# Patient Record
Sex: Female | Born: 1975 | Race: White | Hispanic: No | Marital: Married | Smoking: Former smoker
Health system: Southern US, Community
[De-identification: ages and names within clinical notes are randomized; demographics above are authoritative.]

## PROBLEM LIST (undated history)

## (undated) ENCOUNTER — Inpatient Hospital Stay (HOSPITAL_COMMUNITY): Payer: Self-pay

## (undated) DIAGNOSIS — R51 Headache: Secondary | ICD-10-CM

## (undated) DIAGNOSIS — Z803 Family history of malignant neoplasm of breast: Secondary | ICD-10-CM

## (undated) DIAGNOSIS — N979 Female infertility, unspecified: Secondary | ICD-10-CM

## (undated) DIAGNOSIS — E039 Hypothyroidism, unspecified: Secondary | ICD-10-CM

## (undated) DIAGNOSIS — F329 Major depressive disorder, single episode, unspecified: Secondary | ICD-10-CM

## (undated) DIAGNOSIS — F32A Depression, unspecified: Secondary | ICD-10-CM

## (undated) HISTORY — DX: Headache: R51

## (undated) HISTORY — PX: TONSILLECTOMY: SUR1361

## (undated) HISTORY — PX: KNEE SURGERY: SHX244

## (undated) HISTORY — DX: Major depressive disorder, single episode, unspecified: F32.9

## (undated) HISTORY — DX: Hypothyroidism, unspecified: E03.9

## (undated) HISTORY — DX: Depression, unspecified: F32.A

## (undated) HISTORY — DX: Female infertility, unspecified: N97.9

## (undated) HISTORY — DX: Family history of malignant neoplasm of breast: Z80.3

---

## 2012-11-06 ENCOUNTER — Encounter: Payer: Self-pay | Admitting: Family Medicine

## 2012-11-06 ENCOUNTER — Ambulatory Visit (INDEPENDENT_AMBULATORY_CARE_PROVIDER_SITE_OTHER): Payer: BC Managed Care – PPO | Admitting: Family Medicine

## 2012-11-06 VITALS — BP 110/64 | HR 83 | Temp 98.4°F | Ht 64.0 in | Wt 157.0 lb

## 2012-11-06 DIAGNOSIS — L259 Unspecified contact dermatitis, unspecified cause: Secondary | ICD-10-CM

## 2012-11-06 DIAGNOSIS — L309 Dermatitis, unspecified: Secondary | ICD-10-CM

## 2012-11-06 DIAGNOSIS — Z7689 Persons encountering health services in other specified circumstances: Secondary | ICD-10-CM

## 2012-11-06 DIAGNOSIS — Z7189 Other specified counseling: Secondary | ICD-10-CM

## 2012-11-06 MED ORDER — TRIAMCINOLONE ACETONIDE 0.1 % EX OINT: TOPICAL_OINTMENT | Freq: Two times a day (BID) | CUTANEOUS | Status: DC

## 2012-11-06 NOTE — Patient Instructions (Addendum)
-  dove or aveeno hypoallergenic soap, hypoallergenic detergent for your clothes and either cerave cream or cetaphil restorative lotion after bathing  -use the steroid cream for next 1-2 weeks and with flares  -We have ordered labs or studies at this visit. It can take up to 1-2 weeks for results and processing. We will contact you with instructions IF your results are abnormal. Normal results will be released to your [facility] If you have not heard from [place] or can not find your results in [facility] in 2 weeks please contact our office.  -PLEASE SIGN UP FOR MYCHART TODAY   We recommend the following healthy lifestyle measures: - eat a healthy diet consisting of lots of vegetables, fruits, beans, nuts, seeds, healthy meats such as white chicken and fish and whole grains.  - avoid fried foods, fast food, processed foods, sodas, red meet and other fattening foods.  - get a least 150 minutes of aerobic exercise per week.   Follow up in: 1 month

## 2012-11-06 NOTE — Progress Notes (Signed)
No chief complaint on file.   HPI:  Dawn Moss is here to establish care. Recently moved here from [place].  Last PCP and physical: sees Dr. Tenny Craw at [facility] - UTD on physical and pap.  Has the following chronic problems and concerns today:  Rash: -she has had this on and off seasonally -sometimes worse when in the heat -tx with steroids in the past and this clears it up -recently flared again for the last few weeks -does itch, does not hurt -usually underarms, arms, lower back -can't think of triggers other then seems to be related to heat - or being outside  There are no active problems to display for this patient.    ROS: See pertinent positives and negatives per HPI.  Past Medical History  Diagnosis Date  . Depression     Family History  Problem Relation Age of Onset  . Crohn's disease Mother   . Mental retardation Mother   . Hypertension Father   . Cancer Maternal Grandmother     breast cancer  . Heart disease Maternal Grandfather   . Diabetes Paternal Grandfather   . Heart disease Paternal Grandfather     History   Social History  . Marital Status: Single    Spouse Name: N/A    Number of Children: N/A  . Years of Education: N/A   Social History Main Topics  . Smoking status: Former Smoker    Quit date: 10/07/2010  . Smokeless tobacco: Never Used  . Alcohol Use: Yes     Comment: glass or 2 of wine per week  . Drug Use: No  . Sexually Active: None   Other Topics Concern  . 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 For 1-2 weeks as needed., Disp: 30 g, Rfl: 1  EXAM:  Filed Vitals:   11/06/12 1623  BP: 110/64  Pulse: 83  Temp: 98.4 F (36.9 C)    Body mass index is 26.94 kg/(m^2).  GENERAL: vitals reviewed and listed above, alert, oriented, appears well hydrated and in no acute distress  HEENT: atraumatic, conjunttiva clear, no obvious abnormalities on inspection of external nose and ears  NECK: no obvious masses on inspection  LUNGS: clear to auscultation bilaterally, no wheezes, rales or rhonchi, good air movement  CV: HRRR, no peripheral edema  MS: moves all extremities without noticeable abnormality  SKIN: fine erythematous papular rash in areas where clothes rub - armpits, waistband, thighs  PSYCH: pleasant and cooperative, no obvious depression or anxiety  ASSESSMENT AND PLAN:  Discussed the following assessment and plan:  Eczema - Plan: triamcinolone ointment (KENALOG) 0.1 % -Recommendations per orders an instructions, risks and use of medications and return precautions discussed.  Encounter to establish care  -We reviewed the PMH, PSH,  FH, SH, Meds and Allergies. -We provided refills for any medications we will prescribe as needed. -We addressed current concerns per orders and patient instructions. -We have asked for records for pertinent exams, studies, vaccines and notes from previous providers. -We have advised patient to follow up per instructions below. -screening labs for lipids and hgba1c done in last year per pt and normal  -Patient advised to return or notify a doctor immediately if symptoms worsen or persist or new concerns arise.  Patient Instructions  -dove or aveeno hypoallergenic soap, hypoallergenic detergent for your clothes and either cerave cream or cetaphil restorative lotion after  bathing  -use the steroid cream for next 1-2 weeks and with flares  -We have ordered labs or studies at this visit. It can take up to 1-2 weeks for results and processing. We will contact you with instructions IF your results are abnormal. Normal results will be released to your [facility]. If you have not heard from [place] or can not find your results in [facility] in 2 weeks please contact our office.  -PLEASE SIGN UP FOR MYCHART TODAY   We recommend the following healthy lifestyle measures: - eat a healthy diet consisting of lots of vegetables, fruits, beans, nuts, seeds, healthy meats such as white chicken and fish and whole grains.  - avoid fried foods, fast food, processed foods, sodas, red meet and other fattening foods.  - get a least 150 minutes of aerobic exercise per week.   Follow up in: 1 month      KIM, HANNAH R.

## 2012-11-26 ENCOUNTER — Encounter: Payer: Self-pay | Admitting: Family Medicine

## 2012-11-27 ENCOUNTER — Encounter: Payer: Self-pay | Admitting: Family Medicine

## 2012-11-27 NOTE — Telephone Encounter (Signed)
-  would advise follow up or refer to derm if she prefers?

## 2012-11-27 NOTE — Telephone Encounter (Signed)
Pls advise.  

## 2012-12-01 ENCOUNTER — Encounter: Payer: Self-pay | Admitting: Family Medicine

## 2012-12-01 ENCOUNTER — Ambulatory Visit (INDEPENDENT_AMBULATORY_CARE_PROVIDER_SITE_OTHER): Payer: BC Managed Care – PPO | Admitting: Family Medicine

## 2012-12-01 VITALS — BP 108/80 | Temp 98.9°F | Wt 164.0 lb

## 2012-12-01 DIAGNOSIS — L259 Unspecified contact dermatitis, unspecified cause: Secondary | ICD-10-CM

## 2012-12-01 DIAGNOSIS — L309 Dermatitis, unspecified: Secondary | ICD-10-CM

## 2012-12-01 NOTE — Progress Notes (Signed)
Chief Complaint  Patient presents with  . Rash    HPI:  Follow up rash: -hx eczema, this gets worse several times per year -thinks triggered by heat -tx with hypoallergenic regimen, triamcinilone and emollients last visit -all hypoallergenic products -itchy, painful at times -not really getting better  ROS: See pertinent positives and negatives per HPI.  Past Medical History  Diagnosis Date  . Depression     Family History  Problem Relation Age of Onset  . Crohn's disease Mother   . Mental retardation Mother   . Hypertension Father   . Cancer Maternal Grandmother     breast cancer  . Heart disease Maternal Grandfather   . Diabetes Paternal Grandfather   . Heart disease Paternal Grandfather     History   Social History  . Marital Status: Single    Spouse Name: N/A    Number of Children: N/A  . Years of Education: N/A   Social History Main Topics  . Smoking status: Former Smoker    Quit date: 10/07/2010  . Smokeless tobacco: Never Used  . Alcohol Use: Yes     Comment: glass or 2 of wine per week  . Drug Use: No  . Sexually Active: None   Other Topics Concern  . 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 For 1-2 weeks as needed., Disp: 30 g, Rfl: 1  EXAM:  Filed Vitals:   12/01/12 1514  BP: 108/80  Temp: 98.9 F (37.2 C)    Body mass index is 28.14  kg/(m^2).  GENERAL: vitals reviewed and listed above, alert, oriented, appears well hydrated and in no acute distress  HEENT: atraumatic, conjunttiva clear, no obvious abnormalities on inspection of external nose and ears  NECK: no obvious masses on inspection  SKIN: papular erythematous rash on buttocks, thighs, arms, legs, trunk  MS: moves all extremities without noticeable abnormality  PSYCH: pleasant and cooperative, no obvious depression or anxiety  ASSESSMENT AND PLAN:  Discussed the following assessment and plan:  Dermatitis - Plan: Ambulatory referral to Dermatology  -referral to derm, continue hypoallergenic regimen in the meantime -Patient advised to return or notify a doctor immediately if symptoms worsen or persist or new concerns arise.  There are no Patient Instructions on file for this visit.   Kriste Basque R.

## 2013-04-02 ENCOUNTER — Other Ambulatory Visit: Payer: Self-pay

## 2013-05-19 LAB — OB RESULTS CONSOLE ABO/RH: RH Type: POSITIVE

## 2013-05-19 LAB — OB RESULTS CONSOLE RPR: RPR: NONREACTIVE

## 2013-05-19 LAB — OB RESULTS CONSOLE ANTIBODY SCREEN: ANTIBODY SCREEN: NEGATIVE

## 2013-05-19 LAB — OB RESULTS CONSOLE HIV ANTIBODY (ROUTINE TESTING): HIV: NONREACTIVE

## 2013-05-19 LAB — OB RESULTS CONSOLE HEPATITIS B SURFACE ANTIGEN: Hepatitis B Surface Ag: NEGATIVE

## 2013-05-19 LAB — OB RESULTS CONSOLE RUBELLA ANTIBODY, IGM: Rubella: IMMUNE

## 2013-06-02 LAB — OB RESULTS CONSOLE GC/CHLAMYDIA
CHLAMYDIA, DNA PROBE: NEGATIVE
GC PROBE AMP, GENITAL: NEGATIVE

## 2013-11-26 LAB — OB RESULTS CONSOLE GBS: STREP GROUP B AG: NEGATIVE

## 2013-12-09 ENCOUNTER — Telehealth (HOSPITAL_COMMUNITY): Payer: Self-pay | Admitting: *Deleted

## 2013-12-09 ENCOUNTER — Encounter (HOSPITAL_COMMUNITY): Payer: Self-pay | Admitting: *Deleted

## 2013-12-09 NOTE — Telephone Encounter (Signed)
Preadmission screen  

## 2013-12-11 ENCOUNTER — Encounter (HOSPITAL_COMMUNITY): Payer: Self-pay | Admitting: Pharmacist

## 2013-12-12 ENCOUNTER — Inpatient Hospital Stay (HOSPITAL_COMMUNITY)
Admission: RE | Admit: 2013-12-12 | Discharge: 2013-12-12 | DRG: 781 | Disposition: A | Discharge status: Home / Self Care | Payer: Commercial Managed Care - PPO | Source: Ambulatory Visit | Attending: Obstetrics and Gynecology | Admitting: Obstetrics and Gynecology

## 2013-12-12 ENCOUNTER — Encounter (HOSPITAL_COMMUNITY): Payer: Self-pay

## 2013-12-12 DIAGNOSIS — Z8249 Family history of ischemic heart disease and other diseases of the circulatory system: Secondary | ICD-10-CM

## 2013-12-12 DIAGNOSIS — E039 Hypothyroidism, unspecified: Secondary | ICD-10-CM | POA: Diagnosis present

## 2013-12-12 DIAGNOSIS — Z833 Family history of diabetes mellitus: Secondary | ICD-10-CM

## 2013-12-12 DIAGNOSIS — O99340 Other mental disorders complicating pregnancy, unspecified trimester: Secondary | ICD-10-CM | POA: Diagnosis present

## 2013-12-12 DIAGNOSIS — O368390 Maternal care for abnormalities of the fetal heart rate or rhythm, unspecified trimester, not applicable or unspecified: Secondary | ICD-10-CM | POA: Diagnosis not present

## 2013-12-12 DIAGNOSIS — E079 Disorder of thyroid, unspecified: Secondary | ICD-10-CM | POA: Diagnosis present

## 2013-12-12 DIAGNOSIS — Z803 Family history of malignant neoplasm of breast: Secondary | ICD-10-CM

## 2013-12-12 DIAGNOSIS — O99280 Endocrine, nutritional and metabolic diseases complicating pregnancy, unspecified trimester: Secondary | ICD-10-CM

## 2013-12-12 DIAGNOSIS — O321XX0 Maternal care for breech presentation, not applicable or unspecified: Secondary | ICD-10-CM | POA: Diagnosis present

## 2013-12-12 DIAGNOSIS — F329 Major depressive disorder, single episode, unspecified: Secondary | ICD-10-CM | POA: Diagnosis present

## 2013-12-12 DIAGNOSIS — F3289 Other specified depressive episodes: Secondary | ICD-10-CM | POA: Diagnosis present

## 2013-12-12 DIAGNOSIS — Z87891 Personal history of nicotine dependence: Secondary | ICD-10-CM

## 2013-12-12 MED ORDER — BUTORPHANOL TARTRATE 1 MG/ML IJ SOLN
1.0000 mg | Freq: Once | INTRAMUSCULAR | Status: AC
  Administered 2013-12-12: 1 mg via INTRAVENOUS
  Filled 2013-12-12: qty 1

## 2013-12-12 MED ORDER — TERBUTALINE SULFATE 1 MG/ML IJ SOLN
INTRAMUSCULAR | Status: AC
  Filled 2013-12-12: qty 1

## 2013-12-12 MED ORDER — ONDANSETRON HCL 4 MG/2ML IJ SOLN
4.0000 mg | Freq: Once | INTRAMUSCULAR | Status: AC
  Administered 2013-12-12: 4 mg via INTRAVENOUS
  Filled 2013-12-12: qty 2

## 2013-12-12 MED ORDER — TERBUTALINE SULFATE 1 MG/ML IJ SOLN
0.2500 mg | Freq: Once | INTRAMUSCULAR | Status: AC
  Administered 2013-12-12: 11:00:00 via SUBCUTANEOUS

## 2013-12-12 MED ORDER — LACTATED RINGERS IV SOLN
INTRAVENOUS | Status: DC
  Administered 2013-12-12: 10:00:00 via INTRAVENOUS

## 2013-12-12 NOTE — Progress Notes (Signed)
Patient ID: Dawn Moss, female   DOB: 05/03/1976, 38 y.o.   MRN: [id number] Consent signed. FHR reactive.  Attempted forward roll x one , backward roll x 2. Brief episode of fetal bradycardia resolved. Unsuccessful ECV. Will monitor until FHR reactive post procedure.

## 2013-12-12 NOTE — H&P (Signed)
Dawn Moss is a 38 y.o. female presenting for External version.  Maternal Medical History:  Contractions: Frequency: rare.    Fetal activity: Perceived fetal activity is normal.   Last perceived fetal movement was within the past hour.    Prenatal complications: no prenatal complications Prenatal Complications - Diabetes: none.    OB History   Grav Para Term Preterm Abortions TAB SAB Ect Mult Living   2 1 1       1      Past Medical History  Diagnosis Date  . Depression   . Hypothyroidism   . Family history of malignant neoplasm of breast   . Infertility, female   . ZOXWRUEA(540.9Headache(784.0)    Past Surgical History  Procedure Laterality Date  . Tonsillectomy      1997  . Knee surgery     Family History: family history includes Cancer in her maternal grandmother; Crohn's disease in her mother; Depression in her mother; Diabetes in her paternal grandfather; Heart disease in her maternal grandfather and paternal grandfather; Hypertension in her father. Social History:  reports that she quit smoking about 3 years ago. She has never used smokeless tobacco. She reports that she drinks alcohol. She reports that she does not use illicit drugs.   Prenatal Transfer Tool  Maternal Diabetes: No Genetic Screening: Normal Maternal Ultrasounds/Referrals: Normal Fetal Ultrasounds or other Referrals:  None Maternal Substance Abuse:  No Significant Maternal Medications:  None Significant Maternal Lab Results:  None Other Comments:  None  Review of Systems  All other systems reviewed and are negative.     Blood pressure 118/72, pulse 83, temperature 98.2 F (36.8 C), temperature source Oral, resp. rate 20, height 5\' 4"  (1.626 m), weight 79.379 kg (175 lb), last menstrual period 03/22/2013. Maternal Exam:  Uterine Assessment: Contraction strength is mild.  Contraction frequency is rare.   Abdomen: Patient reports no abdominal tenderness. Fetal presentation: breech  Introitus: Normal  vulva. Normal vagina.  Ferning test: not done.  Nitrazine test: not done. Amniotic fluid character: not assessed.  Pelvis: adequate for delivery.   Cervix: Cervix evaluated by digital exam.     Physical Exam  Nursing note and vitals reviewed. Constitutional: She is oriented to person, place, and time. She appears well-developed and well-nourished.  HENT:  Head: Normocephalic and atraumatic.  Cardiovascular: Normal rate, regular rhythm and normal heart sounds.   Respiratory: Effort normal and breath sounds normal.  GI: Soft. Bowel sounds are normal.  Genitourinary: Vagina normal and uterus normal.  Musculoskeletal: Normal range of motion.  Neurological: She is alert and oriented to person, place, and time.  Skin: Skin is warm and dry.  Psychiatric: She has a normal mood and affect. Her behavior is normal. Judgment and thought content normal.    Prenatal labs: ABO, Rh: O/Positive/-- (12/23 0000) Antibody: Negative (12/23 0000) Rubella: Immune (12/23 0000) RPR: Nonreactive (12/23 0000)  HBsAg: Negative (12/23 0000)  HIV: Non-reactive (12/23 0000)  GBS: Negative (07/02 0000)   Assessment/Plan: 38 1/7 weeks Dawn Moss Breech For ECV Risks vs benefits discussed. Consent done.   Dawn Moss 12/12/2013, 10:31 AM

## 2013-12-12 NOTE — Progress Notes (Signed)
S:  Here for external cephalic version  O:  VS: Blood pressure 118/72, pulse 83, temperature 98.2 F (36.8 C), temperature source Oral, resp. rate 20, height 5\' 4"  (1.626 m), weight 79.379 kg (175 lb), last menstrual period 03/22/2013.        FHR : baseline 140 / variability moderate / accelerations + / no decelerations        Toco: no contractions or uterine irritability        Cervix : exam deferred at this time        Membranes: intact        Bedside SONO - frank breech / cephalic prominence left upper with spine down maternal left side                                     left sacral transverse                                    subjective fluid normal                                    (+) fetal cardiac activity @140   A: ECV attempt for breech presentation at term      Reactive NST with ctx  P: prepare for procedure - obtain consent      Stadol 1 mg IV for relaxation pre-procedure  Dr Billy Coastaavon notified   Marlinda MikeBAILEY, Sarayu Prevost CNM, MSN, [facility] 12/12/2013, 10:02 AM

## 2013-12-12 NOTE — Discharge Instructions (Signed)
Keep MD appt on Wednesday.

## 2013-12-16 ENCOUNTER — Other Ambulatory Visit: Payer: Self-pay | Admitting: Obstetrics and Gynecology

## 2013-12-16 ENCOUNTER — Encounter (HOSPITAL_COMMUNITY): Payer: Self-pay

## 2013-12-16 ENCOUNTER — Encounter (HOSPITAL_COMMUNITY)
Admission: RE | Admit: 2013-12-16 | Discharge: 2013-12-16 | Disposition: A | Discharge status: Home / Self Care | Payer: Commercial Managed Care - PPO | Source: Ambulatory Visit | Attending: Obstetrics and Gynecology | Admitting: Obstetrics and Gynecology

## 2013-12-16 VITALS — BP 113/80 | HR 105 | Temp 97.5°F | Resp 20 | Ht 64.0 in | Wt 176.0 lb

## 2013-12-16 DIAGNOSIS — O321XX1 Maternal care for breech presentation, fetus 1: Secondary | ICD-10-CM

## 2013-12-16 LAB — CBC
HCT: 37.8 % (ref 36.0–46.0)
HEMOGLOBIN: 13.6 g/dL (ref 12.0–15.0)
MCH: 33.4 pg (ref 26.0–34.0)
MCHC: 36 g/dL (ref 30.0–36.0)
MCV: 92.9 fL (ref 78.0–100.0)
Platelets: 198 10*3/uL (ref 150–400)
RBC: 4.07 MIL/uL (ref 3.87–5.11)
RDW: 14.2 % (ref 11.5–15.5)
WBC: 6.8 10*3/uL (ref 4.0–10.5)

## 2013-12-16 LAB — ABO/RH: ABO/RH(D): O POS

## 2013-12-16 LAB — TYPE AND SCREEN
ABO/RH(D): O POS
Antibody Screen: NEGATIVE

## 2013-12-16 LAB — RPR

## 2013-12-16 NOTE — Patient Instructions (Addendum)
[facility] - Preparing for Surgery  Before surgery, you can play an important role.  Because skin is not sterile, your skin needs to be as free of germs as possible.  You can reduce the number of germs on you skin by washing with CHG (chlorahexidine gluconate) soap before surgery.  CHG is an antiseptic cleaner which kills germs and bonds with the skin to continue killing germs even after washing.  Please DO NOT use if you have an allergy to CHG or antibacterial soaps.  If your skin becomes reddened/irritated stop using the CHG and inform your nurse when you arrive at Short Stay.  Do not shave (including legs and underarms) for at least 48 hours prior to the first CHG shower.  You may shave your face.  Please follow these instructions carefully:   1.  Shower with CHG Soap the night before surgery and the                                morning of Surgery.  2.  If you choose to wash your hair, wash your hair first as usual with your       normal shampoo.  3.  After you shampoo, rinse your hair and body thoroughly to remove the                      Shampoo.  4.  Use CHG as you would any other liquid soap.  You can apply chg directly       to the skin and wash gently with scrungie or a clean washcloth.  5.  Apply the CHG Soap to your body ONLY FROM THE NECK DOWN.        Do not use on open wounds or open sores.  Avoid contact with your eyes,       ears, mouth and genitals (private parts).  Wash genitals (private parts)       with your normal soap.  6.  Wash thoroughly, paying special attention to the area where your surgery        will be performed.  7.  Thoroughly rinse your body with warm water from the neck down.  8.  DO NOT shower/wash with your normal soap after using and rinsing off       the CHG Soap.  9.  Pat yourself dry with a clean towel.            10.  Wear clean pajamas.            11.  Place clean sheets on your bed the night of your first shower and do not        sleep with  pets.  Day of Surgery  Do not apply any lotions/deoderants the morning of surgery.  Please wear clean clothes to the hospital/surgery center.    Your procedure is scheduled on: July 24th   [facility] at: 8am Pick up the phone at the desk and dial [phone] and inform us of your arrival.  Please call this number if you have any problems the morning of surgery: [phone]  Remember: Do not eat food after midnight: Do not drink clear liquids after: Take these medicines the morning of surgery with a SIP OF WATER: take meds day of surgery with sip of water  Do not wear jewelry, make-up, or FINGER nail polish No metal  in your hair or on your body. Do not wear lotions, powders, perfumes.  You may wear deodorant.  Do not bring valuables to the hospital. Contacts, dentures or bridgework may not be worn into surgery.  Leave suitcase in the car. After Surgery it may be brought to your room. For patients being admitted to the hospital, checkout time is 11:00am the day of discharge.    Patients discharged on the day of surgery will not be allowed to drive home.

## 2013-12-17 NOTE — H&P (Signed)
NAMTrenton Moss:  Critzer, Stefany                  ACCOUNT NO.:  [id number]  MEDICAL RECORD NO.:  [id number]  LOCATION:                                 FACILITY:  PHYSICIAN:  Lenoard Adenichard J. Jeanee Fabre, M.D.DATE OF BIRTH:  01/12/1976  DATE OF ADMISSION: DATE OF DISCHARGE:                             HISTORY & PHYSICAL   CHIEF COMPLAINT:  Breech, for primary C-section.  HISTORY OF PRESENT ILLNESS:  She is a 38 year old white female G2, P1, at 2639 weeks gestation with persistent breech presentation and failed external cephalic version, for primary C-section.  ALLERGIES:  Shellfish.  MEDICATIONS:  Include Synthroid, prenatal vitamins, maybe some Claritin.  FAMILY HISTORY:  She has a family history of breast cancer and heart disease.  PREVIOUS HISTORY:  Vaginal delivery as noted.  PAST MEDICAL HISTORY:  Thyroid disease.  PHYSICAL EXAMINATION:  GENERAL:  She is a well-developed, well- nourished, white female, in no acute distress. HEENT:  Normal. NECK:  Supple.  Full range of motion. LUNGS:  Clear. HEART:  Regular rate and rhythm. ABDOMEN:  Soft, gravid, nontender.  Estimated fetal weight 7.5 pounds. GU:  Cervix is 2-3 cm, which is breech and -2. EXTREMITIES:  There were no cords. NEUROLOGIC:  Nonfocal. SKIN:  Intact.  IMPRESSION: 1. 39-week intrauterine pregnancy. 2. Refractory breech presentation, status post failed external     cephalic version.  PLAN:  Primary low segment transverse cesarean section.  Risks of anesthesia, infection, bleeding, injury to surrounding organs with possible need for repair was discussed.  Delayed versus immediate complications to include bowel and bladder injury noted.  The patient acknowledges and wishes to proceed.     Lenoard Adenichard J. Carnelius Hammitt, M.D.     RJT/MEDQ  D:  12/17/2013  T:  12/17/2013  Job:  [id number]

## 2013-12-18 ENCOUNTER — Encounter (HOSPITAL_COMMUNITY): Payer: Self-pay | Admitting: *Deleted

## 2013-12-18 ENCOUNTER — Inpatient Hospital Stay (HOSPITAL_COMMUNITY): Payer: Commercial Managed Care - PPO | Admitting: Anesthesiology

## 2013-12-18 ENCOUNTER — Encounter (HOSPITAL_COMMUNITY)
Admission: AD | Disposition: A | Discharge status: Home / Self Care | Payer: Self-pay | Source: Ambulatory Visit | Attending: Obstetrics and Gynecology

## 2013-12-18 ENCOUNTER — Inpatient Hospital Stay (HOSPITAL_COMMUNITY)
Admission: AD | Admit: 2013-12-18 | Discharge: 2013-12-20 | DRG: 766 | Disposition: A | Discharge status: Home / Self Care | Payer: Commercial Managed Care - PPO | Source: Ambulatory Visit | Attending: Obstetrics and Gynecology | Admitting: Obstetrics and Gynecology

## 2013-12-18 ENCOUNTER — Encounter (HOSPITAL_COMMUNITY): Payer: Commercial Managed Care - PPO | Admitting: Anesthesiology

## 2013-12-18 DIAGNOSIS — O09529 Supervision of elderly multigravida, unspecified trimester: Secondary | ICD-10-CM | POA: Diagnosis present

## 2013-12-18 DIAGNOSIS — O321XX0 Maternal care for breech presentation, not applicable or unspecified: Secondary | ICD-10-CM | POA: Diagnosis present

## 2013-12-18 DIAGNOSIS — O99344 Other mental disorders complicating childbirth: Secondary | ICD-10-CM | POA: Diagnosis present

## 2013-12-18 DIAGNOSIS — F341 Dysthymic disorder: Secondary | ICD-10-CM | POA: Diagnosis present

## 2013-12-18 DIAGNOSIS — E039 Hypothyroidism, unspecified: Secondary | ICD-10-CM | POA: Diagnosis present

## 2013-12-18 DIAGNOSIS — E079 Disorder of thyroid, unspecified: Secondary | ICD-10-CM | POA: Diagnosis present

## 2013-12-18 DIAGNOSIS — Z803 Family history of malignant neoplasm of breast: Secondary | ICD-10-CM

## 2013-12-18 DIAGNOSIS — O99284 Endocrine, nutritional and metabolic diseases complicating childbirth: Secondary | ICD-10-CM

## 2013-12-18 LAB — BIRTH TISSUE RECOVERY COLLECTION (PLACENTA DONATION)

## 2013-12-18 SURGERY — Surgical Case
Anesthesia: Spinal | Site: Abdomen

## 2013-12-18 MED ORDER — MEPERIDINE HCL 25 MG/ML IJ SOLN: 6.2500 mg | INTRAMUSCULAR | Status: DC | PRN

## 2013-12-18 MED ORDER — BUPIVACAINE HCL (PF) 0.25 % IJ SOLN
INTRAMUSCULAR | Status: AC
  Filled 2013-12-18: qty 30

## 2013-12-18 MED ORDER — FENTANYL CITRATE 0.05 MG/ML IJ SOLN: 25.0000 ug | INTRAMUSCULAR | Status: DC | PRN

## 2013-12-18 MED ORDER — SENNOSIDES-DOCUSATE SODIUM 8.6-50 MG PO TABS
2.0000 | ORAL_TABLET | ORAL | Status: DC
  Administered 2013-12-18 – 2013-12-19 (×2): 2 via ORAL
  Filled 2013-12-18 (×2): qty 2

## 2013-12-18 MED ORDER — CEFAZOLIN SODIUM-DEXTROSE 2-3 GM-% IV SOLR
INTRAVENOUS | Status: AC
  Filled 2013-12-18: qty 50

## 2013-12-18 MED ORDER — SODIUM CHLORIDE 0.9 % IJ SOLN: 3.0000 mL | INTRAMUSCULAR | Status: DC | PRN

## 2013-12-18 MED ORDER — ONDANSETRON HCL 4 MG/2ML IJ SOLN: 4.0000 mg | Freq: Three times a day (TID) | INTRAMUSCULAR | Status: DC | PRN

## 2013-12-18 MED ORDER — CEFAZOLIN SODIUM-DEXTROSE 2-3 GM-% IV SOLR
2.0000 g | INTRAVENOUS | Status: AC
  Administered 2013-12-18: 2 g via INTRAVENOUS

## 2013-12-18 MED ORDER — PHENYLEPHRINE 8 MG IN D5W 100 ML (0.08MG/ML) PREMIX OPTIME
INJECTION | INTRAVENOUS | Status: DC | PRN
  Administered 2013-12-18: 60 ug/min via INTRAVENOUS

## 2013-12-18 MED ORDER — SIMETHICONE 80 MG PO CHEW: 80.0000 mg | CHEWABLE_TABLET | ORAL | Status: DC | PRN

## 2013-12-18 MED ORDER — ZOLPIDEM TARTRATE 5 MG PO TABS: 5.0000 mg | ORAL_TABLET | Freq: Every evening | ORAL | Status: DC | PRN

## 2013-12-18 MED ORDER — WITCH HAZEL-GLYCERIN EX PADS: 1.0000 "application " | MEDICATED_PAD | CUTANEOUS | Status: DC | PRN

## 2013-12-18 MED ORDER — PHENYLEPHRINE 8 MG IN D5W 100 ML (0.08MG/ML) PREMIX OPTIME
INJECTION | INTRAVENOUS | Status: AC
  Filled 2013-12-18: qty 100

## 2013-12-18 MED ORDER — TETANUS-DIPHTH-ACELL PERTUSSIS 5-2.5-18.5 LF-MCG/0.5 IM SUSP: 0.5000 mL | Freq: Once | INTRAMUSCULAR | Status: DC

## 2013-12-18 MED ORDER — DIPHENHYDRAMINE HCL 50 MG/ML IJ SOLN: 25.0000 mg | INTRAMUSCULAR | Status: DC | PRN

## 2013-12-18 MED ORDER — BUPIVACAINE HCL (PF) 0.25 % IJ SOLN
INTRAMUSCULAR | Status: DC | PRN
  Administered 2013-12-18: 10 mL

## 2013-12-18 MED ORDER — KETOROLAC TROMETHAMINE 60 MG/2ML IM SOLN
60.0000 mg | Freq: Once | INTRAMUSCULAR | Status: AC | PRN
  Administered 2013-12-18: 60 mg via INTRAMUSCULAR

## 2013-12-18 MED ORDER — OXYTOCIN 40 UNITS IN LACTATED RINGERS INFUSION - SIMPLE MED: 62.5000 mL/h | INTRAVENOUS | Status: AC

## 2013-12-18 MED ORDER — OXYCODONE-ACETAMINOPHEN 5-325 MG PO TABS
1.0000 | ORAL_TABLET | ORAL | Status: DC | PRN
  Administered 2013-12-18: 1 via ORAL
  Administered 2013-12-19 (×5): 2 via ORAL
  Administered 2013-12-19: 1 via ORAL
  Administered 2013-12-20 (×3): 2 via ORAL
  Filled 2013-12-18 (×4): qty 2
  Filled 2013-12-18: qty 1
  Filled 2013-12-18 (×4): qty 2
  Filled 2013-12-18: qty 1

## 2013-12-18 MED ORDER — DIBUCAINE 1 % RE OINT: 1.0000 "application " | TOPICAL_OINTMENT | RECTAL | Status: DC | PRN

## 2013-12-18 MED ORDER — FENTANYL CITRATE 0.05 MG/ML IJ SOLN
INTRAMUSCULAR | Status: DC | PRN
  Administered 2013-12-18: 25 ug via INTRATHECAL

## 2013-12-18 MED ORDER — METOCLOPRAMIDE HCL 5 MG/ML IJ SOLN: 10.0000 mg | Freq: Three times a day (TID) | INTRAMUSCULAR | Status: DC | PRN

## 2013-12-18 MED ORDER — MENTHOL 3 MG MT LOZG: 1.0000 | LOZENGE | OROMUCOSAL | Status: DC | PRN

## 2013-12-18 MED ORDER — ONDANSETRON HCL 4 MG/2ML IJ SOLN: 4.0000 mg | INTRAMUSCULAR | Status: DC | PRN

## 2013-12-18 MED ORDER — ONDANSETRON HCL 4 MG/2ML IJ SOLN
INTRAMUSCULAR | Status: DC | PRN
  Administered 2013-12-18: 4 mg via INTRAVENOUS

## 2013-12-18 MED ORDER — SCOPOLAMINE 1 MG/3DAYS TD PT72
MEDICATED_PATCH | TRANSDERMAL | Status: AC
  Administered 2013-12-18: 1.5 mg via TRANSDERMAL
  Filled 2013-12-18: qty 1

## 2013-12-18 MED ORDER — DIPHENHYDRAMINE HCL 50 MG/ML IJ SOLN
INTRAMUSCULAR | Status: DC | PRN
  Administered 2013-12-18: 25 mg via INTRAVENOUS

## 2013-12-18 MED ORDER — DIPHENHYDRAMINE HCL 50 MG/ML IJ SOLN: 12.5000 mg | INTRAMUSCULAR | Status: DC | PRN

## 2013-12-18 MED ORDER — KETOROLAC TROMETHAMINE 60 MG/2ML IM SOLN
INTRAMUSCULAR | Status: AC
  Filled 2013-12-18: qty 2

## 2013-12-18 MED ORDER — DEXTROSE 5 % IV SOLN: 1.0000 ug/kg/h | INTRAVENOUS | Status: DC | PRN

## 2013-12-18 MED ORDER — MORPHINE SULFATE (PF) 0.5 MG/ML IJ SOLN
INTRAMUSCULAR | Status: DC | PRN
  Administered 2013-12-18: .15 mg via INTRATHECAL

## 2013-12-18 MED ORDER — SCOPOLAMINE 1 MG/3DAYS TD PT72
1.0000 | MEDICATED_PATCH | Freq: Once | TRANSDERMAL | Status: DC
  Administered 2013-12-18: 1.5 mg via TRANSDERMAL

## 2013-12-18 MED ORDER — LACTATED RINGERS IV SOLN
INTRAVENOUS | Status: DC
  Administered 2013-12-18: 16:00:00 via INTRAVENOUS

## 2013-12-18 MED ORDER — DIPHENHYDRAMINE HCL 25 MG PO CAPS
25.0000 mg | ORAL_CAPSULE | ORAL | Status: DC | PRN
  Administered 2013-12-18: 25 mg via ORAL
  Filled 2013-12-18: qty 1

## 2013-12-18 MED ORDER — MORPHINE SULFATE 0.5 MG/ML IJ SOLN
INTRAMUSCULAR | Status: AC
  Filled 2013-12-18: qty 10

## 2013-12-18 MED ORDER — PRENATAL MULTIVITAMIN CH
1.0000 | ORAL_TABLET | Freq: Every day | ORAL | Status: DC
  Administered 2013-12-19 – 2013-12-20 (×2): 1 via ORAL
  Filled 2013-12-18 (×2): qty 1

## 2013-12-18 MED ORDER — SIMETHICONE 80 MG PO CHEW
80.0000 mg | CHEWABLE_TABLET | ORAL | Status: DC
  Administered 2013-12-18 – 2013-12-19 (×2): 80 mg via ORAL
  Filled 2013-12-18 (×2): qty 1

## 2013-12-18 MED ORDER — ONDANSETRON HCL 4 MG/2ML IJ SOLN
INTRAMUSCULAR | Status: AC
  Filled 2013-12-18: qty 2

## 2013-12-18 MED ORDER — LEVOTHYROXINE SODIUM 25 MCG PO TABS
25.0000 ug | ORAL_TABLET | Freq: Every day | ORAL | Status: DC
  Administered 2013-12-19 – 2013-12-20 (×2): 25 ug via ORAL
  Filled 2013-12-18 (×2): qty 1

## 2013-12-18 MED ORDER — DIPHENHYDRAMINE HCL 25 MG PO CAPS: 25.0000 mg | ORAL_CAPSULE | Freq: Four times a day (QID) | ORAL | Status: DC | PRN

## 2013-12-18 MED ORDER — NALBUPHINE HCL 10 MG/ML IJ SOLN
5.0000 mg | INTRAMUSCULAR | Status: DC | PRN
  Administered 2013-12-19: 10 mg via SUBCUTANEOUS

## 2013-12-18 MED ORDER — OXYTOCIN 10 UNIT/ML IJ SOLN
40.0000 [IU] | INTRAVENOUS | Status: DC | PRN
  Administered 2013-12-18: 40 [IU] via INTRAVENOUS

## 2013-12-18 MED ORDER — KETOROLAC TROMETHAMINE 30 MG/ML IJ SOLN: 30.0000 mg | Freq: Four times a day (QID) | INTRAMUSCULAR | Status: AC | PRN

## 2013-12-18 MED ORDER — LACTATED RINGERS IV SOLN
Freq: Once | INTRAVENOUS | Status: AC
  Administered 2013-12-18 (×2): via INTRAVENOUS

## 2013-12-18 MED ORDER — FENTANYL CITRATE 0.05 MG/ML IJ SOLN
INTRAMUSCULAR | Status: AC
  Filled 2013-12-18: qty 2

## 2013-12-18 MED ORDER — KETOROLAC TROMETHAMINE 30 MG/ML IJ SOLN
30.0000 mg | Freq: Four times a day (QID) | INTRAMUSCULAR | Status: AC | PRN
  Administered 2013-12-18: 30 mg via INTRAVENOUS
  Filled 2013-12-18: qty 1

## 2013-12-18 MED ORDER — IBUPROFEN 600 MG PO TABS
600.0000 mg | ORAL_TABLET | Freq: Four times a day (QID) | ORAL | Status: DC
  Administered 2013-12-18 – 2013-12-20 (×7): 600 mg via ORAL
  Filled 2013-12-18 (×7): qty 1

## 2013-12-18 MED ORDER — METHYLERGONOVINE MALEATE 0.2 MG PO TABS: 0.2000 mg | ORAL_TABLET | ORAL | Status: DC | PRN

## 2013-12-18 MED ORDER — NALBUPHINE HCL 10 MG/ML IJ SOLN
5.0000 mg | INTRAMUSCULAR | Status: DC | PRN
  Administered 2013-12-18 – 2013-12-19 (×2): 10 mg via INTRAVENOUS
  Filled 2013-12-18 (×3): qty 1

## 2013-12-18 MED ORDER — LANOLIN HYDROUS EX OINT: 1.0000 "application " | TOPICAL_OINTMENT | CUTANEOUS | Status: DC | PRN

## 2013-12-18 MED ORDER — NALOXONE HCL 0.4 MG/ML IJ SOLN: 0.4000 mg | INTRAMUSCULAR | Status: DC | PRN

## 2013-12-18 MED ORDER — ONDANSETRON HCL 4 MG PO TABS
4.0000 mg | ORAL_TABLET | ORAL | Status: DC | PRN
  Administered 2013-12-19 – 2013-12-20 (×4): 4 mg via ORAL
  Filled 2013-12-18 (×4): qty 1

## 2013-12-18 MED ORDER — METOCLOPRAMIDE HCL 5 MG/ML IJ SOLN: 10.0000 mg | Freq: Once | INTRAMUSCULAR | Status: DC | PRN

## 2013-12-18 MED ORDER — OXYTOCIN 10 UNIT/ML IJ SOLN
INTRAMUSCULAR | Status: AC
  Filled 2013-12-18: qty 4

## 2013-12-18 MED ORDER — SCOPOLAMINE 1 MG/3DAYS TD PT72: 1.0000 | MEDICATED_PATCH | Freq: Once | TRANSDERMAL | Status: DC

## 2013-12-18 MED ORDER — LACTATED RINGERS IV SOLN
INTRAVENOUS | Status: DC
  Administered 2013-12-18: 09:00:00 via INTRAVENOUS

## 2013-12-18 MED ORDER — SIMETHICONE 80 MG PO CHEW
80.0000 mg | CHEWABLE_TABLET | Freq: Three times a day (TID) | ORAL | Status: DC
  Administered 2013-12-19 – 2013-12-20 (×4): 80 mg via ORAL
  Filled 2013-12-18 (×5): qty 1

## 2013-12-18 MED ORDER — METHYLERGONOVINE MALEATE 0.2 MG/ML IJ SOLN: 0.2000 mg | INTRAMUSCULAR | Status: DC | PRN

## 2013-12-18 SURGICAL SUPPLY — 35 items
BLADE SURG 10 STRL SS (BLADE) ×4 IMPLANT
CLAMP CORD UMBIL (MISCELLANEOUS) IMPLANT
CLOTH BEACON ORANGE TIMEOUT ST (SAFETY) ×2 IMPLANT
CONTAINER PREFILL 10% NBF 15ML (MISCELLANEOUS) IMPLANT
DERMABOND ADVANCED (GAUZE/BANDAGES/DRESSINGS) ×1
DERMABOND ADVANCED .7 DNX12 (GAUZE/BANDAGES/DRESSINGS) ×1 IMPLANT
DRAPE LG THREE QUARTER DISP (DRAPES) IMPLANT
DRSG OPSITE POSTOP 4X10 (GAUZE/BANDAGES/DRESSINGS) ×2 IMPLANT
DURAPREP 26ML APPLICATOR (WOUND CARE) ×2 IMPLANT
ELECT REM PT RETURN 9FT ADLT (ELECTROSURGICAL) ×2
ELECTRODE REM PT RTRN 9FT ADLT (ELECTROSURGICAL) ×1 IMPLANT
EXTRACTOR VACUUM M CUP 4 TUBE (SUCTIONS) IMPLANT
GLOVE BIO SURGEON STRL SZ7.5 (GLOVE) ×2 IMPLANT
GOWN STRL REUS W/TWL LRG LVL3 (GOWN DISPOSABLE) ×4 IMPLANT
KIT ABG SYR 3ML LUER SLIP (SYRINGE) IMPLANT
NEEDLE HYPO 25X1 1.5 SAFETY (NEEDLE) ×2 IMPLANT
NEEDLE HYPO 25X5/8 SAFETYGLIDE (NEEDLE) IMPLANT
NEEDLE SPNL 20GX3.5 QUINCKE YW (NEEDLE) IMPLANT
NS IRRIG 1000ML POUR BTL (IV SOLUTION) ×2 IMPLANT
PACK C SECTION WH (CUSTOM PROCEDURE TRAY) ×2 IMPLANT
STAPLER VISISTAT 35W (STAPLE) IMPLANT
SUT MNCRL 0 VIOLET CTX 36 (SUTURE) ×2 IMPLANT
SUT MNCRL AB 3-0 PS2 27 (SUTURE) IMPLANT
SUT MON AB 2-0 CT1 27 (SUTURE) ×2 IMPLANT
SUT MON AB-0 CT1 36 (SUTURE) ×4 IMPLANT
SUT MONOCRYL 0 CTX 36 (SUTURE) ×2
SUT PLAIN 0 NONE (SUTURE) IMPLANT
SUT PLAIN 2 0 (SUTURE)
SUT PLAIN 2 0 XLH (SUTURE) IMPLANT
SUT PLAIN ABS 2-0 CT1 27XMFL (SUTURE) IMPLANT
SYR 20CC LL (SYRINGE) IMPLANT
SYR CONTROL 10ML LL (SYRINGE) ×2 IMPLANT
TOWEL OR 17X24 6PK STRL BLUE (TOWEL DISPOSABLE) ×2 IMPLANT
TRAY FOLEY CATH 14FR (SET/KITS/TRAYS/PACK) ×2 IMPLANT
WATER STERILE IRR 1000ML POUR (IV SOLUTION) ×2 IMPLANT

## 2013-12-18 NOTE — Anesthesia Procedure Notes (Signed)

## 2013-12-18 NOTE — Progress Notes (Signed)
Patient ID: Dawn Moss, female   DOB: 10/06/1975, 38 y.o.   MRN: [id number] Patient seen and examined. Consent witnessed and signed. No changes noted. Update completed. Breech confirmed. CBC    Component Value Date/Time   WBC 6.8 12/16/2013 0945   RBC 4.07 12/16/2013 0945   HGB 13.6 12/16/2013 0945   HCT 37.8 12/16/2013 0945   PLT 198 12/16/2013 0945   MCV 92.9 12/16/2013 0945   MCH 33.4 12/16/2013 0945   MCHC 36.0 12/16/2013 0945   RDW 14.2 12/16/2013 0945

## 2013-12-18 NOTE — Lactation Note (Addendum)
This note was copied from the chart of Dawn Moss. Lactation Consultation Note  Patient Name: Dawn Trenton GammonMary Nevin UJWJX'BToday's Date: 12/18/2013 Reason for consult: Initial assessment  Initial consult.  Mom 38 yo P2 has history of hypothyroidism and infertility with both first and second children but reports positive breast changes during pregnancy. Chart review - Infant is 9 hrs old at time of visit GA 39.0, has breastfed x3 (10 min); voids-2; stools-0.  Mom called for latch assistance.  Encouraged mom to have thyroid levels checked postpartum d/t potential effect of low milk supply with hypothroidism.  Mom is on synthroid.  Hand expression taught and return demonstration with colostrum pouring from nipple.  Taught spoon feeding and spoon fed 2 ml colostrum to infant.  LC assisted parents with latching infant to left breast cross-cradle hold teaching sandwiching of breast, dad assisting with "tea cup" hold, and asymmetrical latching technique.  Infant fed for a few minutes and then mom switched infant and put on right breast in football hold independently with dad assisting.  Swallows heard. LS-8.  Infant fed for an additional 20 minutes.  Hand pump given for extra stimulation d/t mom's history.  Curved-tip syringe and colostrum collection container given and explained how to use for spoon feeding and gathering colostrum.  Lactation brochure given and informed of hospital support group and outpatient services.  Mom has a DEBP at home.  Discussed setting up with a DEBP in hospital tomorrow for extra stimulation, mom agreed.  Encouraged to focus on feedings this evening.  Educated on size of infant's stomach, feeding cues, and cluster feeding.  Parents receptive to all teaching and very appreciative of assistance.  Encouraged to call for assistance if needed.     Maternal Data Formula Feeding for Exclusion: No Infant to breast within first hour of birth: Yes Has patient been taught Hand Expression?: Yes Does  the patient have breastfeeding experience prior to this delivery?: Yes  Feeding Feeding Type: Breast Milk Length of feed: 20 min  LATCH Score/Interventions Latch: Grasps breast easily, tongue down, lips flanged, rhythmical sucking.  Audible Swallowing: A few with stimulation Intervention(s): Skin to skin;Hand expression  Type of Nipple: Everted at rest and after stimulation  Comfort (Breast/Nipple): Soft / non-tender     Hold (Positioning): Assistance needed to correctly position infant at breast and maintain latch. Intervention(s): Breastfeeding basics reviewed;Support Pillows;Position options;Skin to skin  LATCH Score: 8  Lactation Tools Discussed/Used Tools: Pump;Other (comment) (spoon) Breast pump type: Manual WIC Program: No Pump Review: Milk Storage;Setup, frequency, and cleaning   Consult Status Consult Status: Follow-up Date: 12/19/13 Follow-up type: In-patient    Lendon KaVann, Seylah Wernert Walker 12/18/2013, 8:10 PM

## 2013-12-18 NOTE — Anesthesia Postprocedure Evaluation (Signed)
  Anesthesia Post-op Note  Patient: Dawn Moss  Procedure(s) Performed: Procedure(s): Primary CESAREAN SECTION (N/A)  Patient is awake, responsive, moving her legs, and has signs of resolution of her numbness. Pain and nausea are reasonably well controlled. Vital signs are stable and clinically acceptable. Oxygen saturation is clinically acceptable. There are no apparent anesthetic complications at this time. Patient is ready for discharge.

## 2013-12-18 NOTE — Transfer of Care (Signed)
Immediate Anesthesia Transfer of Care Note  Patient: Dawn Moss  Procedure(s) Performed: Procedure(s): Primary CESAREAN SECTION (N/A)  Patient Location: PACU  Anesthesia Type:Spinal  Level of Consciousness: awake, alert  and oriented  Airway & Oxygen Therapy: Patient Spontanous Breathing  Post-op Assessment: Report given to PACU RN and Post -op Vital signs reviewed and stable  Post vital signs: Reviewed and stable  Complications: No apparent anesthesia complications

## 2013-12-18 NOTE — Op Note (Signed)
Cesarean Section Procedure Note  Indications: malpresentation: [name] with failed ECV  Pre-operative Diagnosis: 39 week 0 day pregnancy.  Post-operative Diagnosis: same  Surgeon: Lenoard AdenAAVON,Neil Brickell J   Assistants: Kathi LudwigBailey CNM, FACM  Anesthesia: Local anesthesia 0.25.% bupivacaine and Spinal anesthesia  ASA Class: 2  Procedure Details  The patient was seen in the Holding Room. The risks, benefits, complications, treatment options, and expected outcomes were discussed with the patient.  The patient concurred with the proposed plan, giving informed consent. The risks of anesthesia, infection, bleeding and possible injury to other organs discussed. Injury to bowel, bladder, or ureter with possible need for repair discussed. Possible need for transfusion with secondary risks of hepatitis or HIV acquisition discussed. Post operative complications to include but not limited to DVT, PE and Pneumonia noted. The site of surgery properly noted/marked. The patient was taken to Operating Room # 9, identified as [name] and the procedure verified as C-Section Delivery. A Time Out was held and the above information confirmed.  After induction of anesthesia, the patient was draped and prepped in the usual sterile manner. A Pfannenstiel incision was made and carried down through the subcutaneous tissue to the fascia. Fascial incision was made and extended transversely using Mayo scissors. The fascia was separated from the underlying rectus tissue superiorly and inferiorly. The peritoneum was identified and entered. Peritoneal incision was extended longitudinally. The utero-vesical peritoneal reflection was incised transversely and the bladder flap was bluntly freed from the lower uterine segment. A low transverse uterine incision(Kerr hysterotomy) was made. Delivered from frank breech presentation with standard maneuvers was a  female with Apgar scores of 9 at one minute and 9 at five minutes. Bulb  suctioning gently performed. Neonatal team in attendance.After the umbilical cord was clamped and cut cord blood was obtained for evaluation. The placenta was removed intact and appeared normal. The uterus was curetted with a dry lap pack. Good hemostasis was noted.The uterine outline, tubes and ovaries appeared normal. The uterine incision was closed with running locked sutures of 0 Monocryl x 2 layers. Hemostasis was observed. Lavage was carried out until clear.The parietal peritoneum was closed with a running 2-0 Monocryl suture. The fascia was then reapproximated with running sutures of 0 Monocryl. The skin was reapproximated with 3-0 monocryl after Remington closure with 2-0 plain.  Instrument, sponge, and needle counts were correct prior the abdominal closure and at the conclusion of the case.   Findings: As noted  Estimated Blood Loss:  300 mL         Drains: foley                 Specimens: placenta                 Complications:  None; patient tolerated the procedure well.         Disposition: PACU - hemodynamically stable.         Condition: stable  Attending Attestation: I performed the procedure.

## 2013-12-18 NOTE — Anesthesia Preprocedure Evaluation (Addendum)
Anesthesia Evaluation  Patient identified by MRN, date of birth, ID band Patient awake    Reviewed: Allergy & Precautions, H&P , NPO status , Patient's Chart, lab work & pertinent test results, reviewed documented beta blocker date and time   History of Anesthesia Complications Negative for: history of anesthetic complications  Airway Mallampati: II TM Distance: >3 FB Neck ROM: full    Dental  (+) Teeth Intact   Pulmonary neg pulmonary ROS, former smoker,  breath sounds clear to auscultation        Cardiovascular negative cardio ROS  Rhythm:regular Rate:Normal     Neuro/Psych Depression negative neurological ROS     GI/Hepatic negative GI ROS, Neg liver ROS,   Endo/Other  Hypothyroidism   Renal/GU negative Renal ROS     Musculoskeletal   Abdominal   Peds  Hematology negative hematology ROS (+)   Anesthesia Other Findings   Reproductive/Obstetrics (+) Pregnancy (breech)                          Anesthesia Physical Anesthesia Plan  ASA: II  Anesthesia Plan: Spinal   Post-op Pain Management:    Induction:   Airway Management Planned:   Additional Equipment:   Intra-op Plan:   Post-operative Plan:   Informed Consent: I have reviewed the patients History and Physical, chart, labs and discussed the procedure including the risks, benefits and alternatives for the proposed anesthesia with the patient or authorized representative who has indicated his/her understanding and acceptance.     Plan Discussed with: Surgeon and CRNA  Anesthesia Plan Comments:         Anesthesia Quick Evaluation

## 2013-12-19 LAB — CBC
HEMATOCRIT: 29.1 % — AB (ref 36.0–46.0)
HEMOGLOBIN: 9.7 g/dL — AB (ref 12.0–15.0)
MCH: 31.4 pg (ref 26.0–34.0)
MCHC: 33.3 g/dL (ref 30.0–36.0)
MCV: 94.2 fL (ref 78.0–100.0)
Platelets: 145 10*3/uL — ABNORMAL LOW (ref 150–400)
RBC: 3.09 MIL/uL — ABNORMAL LOW (ref 3.87–5.11)
RDW: 14.5 % (ref 11.5–15.5)
WBC: 6.1 10*3/uL (ref 4.0–10.5)

## 2013-12-19 NOTE — Lactation Note (Signed)
This note was copied from the chart of Dawn Moss. Lactation Consultation Note  Patient Name: Dawn Trenton GammonMary Sweeten ZOXWR'UToday's Date: 12/19/2013 Reason for consult: Follow-up assessment Basic teaching reviewed with parents, some assist with positioning. Baby demonstrated a good rhythmic suck with audible swallows with stimulation. Questions answered. Engorgement care reviewed if needed. Mom plans to come to support group.   Maternal Data    Feeding Feeding Type: Breast Fed Length of feed: 15 min  LATCH Score/Interventions Latch: Grasps breast easily, tongue down, lips flanged, rhythmical sucking.  Audible Swallowing: A few with stimulation Intervention(s): Skin to skin Intervention(s): Skin to skin  Type of Nipple: Everted at rest and after stimulation  Comfort (Breast/Nipple): Soft / non-tender     Hold (Positioning): Assistance needed to correctly position infant at breast and maintain latch. Intervention(s): Breastfeeding basics reviewed;Support Pillows;Position options;Skin to skin  LATCH Score: 8  Lactation Tools Discussed/Used     Consult Status Consult Status: Follow-up Date: 12/20/13 Follow-up type: In-patient    Alfred LevinsGranger, Kinslee Dalpe Ann 12/19/2013, 8:38 PM

## 2013-12-19 NOTE — Anesthesia Postprocedure Evaluation (Signed)
  Anesthesia Post-op Note  Patient: Dawn Moss  Procedure(s) Performed: Procedure(s): Primary CESAREAN SECTION (N/A)  Patient Location: PACU and Mother/Baby  Anesthesia Type:Spinal  Level of Consciousness: awake, alert  and oriented  Airway and Oxygen Therapy: Patient Spontanous Breathing  Post-op Pain: mild  Post-op Assessment: Patient's Cardiovascular Status Stable, Respiratory Function Stable, No signs of Nausea or vomiting and Pain level controlled  Post-op Vital Signs: stable  Complications: No apparent anesthesia complications  Last Vitals:  Filed Vitals:   12/19/13 0618  BP: 100/51  Pulse: 72  Temp: 36.7 C  Resp: 18    Complications: No apparent anesthesia complications

## 2013-12-19 NOTE — Addendum Note (Signed)
Addendum created 12/19/13 1055 by Elbert Ewingsolleen S Dontavis Tschantz, CRNA   Modules edited: Notes Section   Notes Section:  File: [id number]

## 2013-12-19 NOTE — Progress Notes (Signed)
Acknowledged order for social work consult.  Mother had multiple visitors.   Agreed to return in the morning.   

## 2013-12-19 NOTE — Progress Notes (Signed)
Patient ID: Dawn Moss, female   DOB: 02/21/1976, 38 y.o.   MRN: [id number] Subjective: POD# 1 Information for the patient's newborn:  Dawn Moss [[id number]][[id number]]  female   Reports feeling sore, but well Feeding: breast Patient reports tolerating PO.  Breast symptoms: none Pain controlled with ibuprofen (OTC) and narcotic analgesics including Percocet Denies HA/SOB/C/P/N/V/dizziness. Flatus present, no BM. She reports vaginal bleeding as normal, without clots.  She is ambulating, urinating without difficult.     Objective:   VS:  Filed Vitals:   12/18/13 2051 12/19/13 0100 12/19/13 0500 12/19/13 0618  BP: 107/72 94/62 106/72 100/51  Pulse: 88 77 72 72  Temp: 98 F (36.7 C) 97.8 F (36.6 C) 97.9 F (36.6 C) 98 F (36.7 C)  TempSrc: Oral Oral Oral Oral  Resp: 18 18 18 18   Height:      Weight:      SpO2: 96% 98% 98% 96%     Intake/Output Summary (Last 24 hours) at 12/19/13 [id number] Last data filed at 12/19/13 0400  Gross per 24 hour  Intake   3580 ml  Output   5400 ml  Net  -1820 ml        Recent Labs  12/16/13 0945 12/19/13 0610  WBC 6.8 6.1  HGB 13.6 9.7*  HCT 37.8 29.1*  PLT 198 145*     Blood type: --/--/O POS, O POS (07/22 0945)  Rubella: Immune (12/23 0000)     Physical Exam:  General: alert, cooperative and no distress CV: Regular rate and rhythm, S1S2 present or without murmur or extra heart sounds Resp: clear Abdomen: soft, nontender, normal bowel sounds Incision: Tegaderm and Honeycomb dressings C/D/I Uterine Fundus: firm, below umbilicus, nontender Lochia: minimal Ext: extremities normal, atraumatic, no cyanosis or edema, Homans sign is negative, no sign of DVT and no edema, redness or tenderness in the calves or thighs   Assessment/Plan: 38 y.o.   POD# 1.  s/p Cesarean Delivery.  Indications: malpresentation: Dawn Moss with failed ECV                Principal Problem:   Postpartum care following cesarean delivery (7/24) Active Problems:  Moss birth Low back pain  Doing well, stable.               Regular diet as tolerated D/C IV per unit protocol Ambulate K-pad for back pain Routine post-op care  Considering early discharge tomorrow AM  Dawn Moss 12/19/2013, 8:12 AM

## 2013-12-20 MED ORDER — ONDANSETRON HCL 4 MG PO TABS: 4.0000 mg | ORAL_TABLET | ORAL | Status: DC | PRN

## 2013-12-20 MED ORDER — IBUPROFEN 600 MG PO TABS: 600.0000 mg | ORAL_TABLET | Freq: Four times a day (QID) | ORAL | Status: DC

## 2013-12-20 MED ORDER — OXYCODONE-ACETAMINOPHEN 5-325 MG PO TABS: 1.0000 | ORAL_TABLET | ORAL | Status: DC | PRN

## 2013-12-20 NOTE — Progress Notes (Signed)
Clinical Social Work Department PSYCHOSOCIAL ASSESSMENT - MATERNAL/CHILD 12/20/2013  Patient:  Dawn Moss,[place] C  Account Number:  [id number]  Admit Date:  12/18/2013  Childs Name:   Dawn Moss    Clinical Social Worker:  Diva Lemberger, LCSW   Date/Time:  12/20/2013 08:15 AM  Date Referred:  12/19/2013      Referred reason  Depression/Anxiety   Other referral source:    I:  FAMILY / HOME ENVIRONMENT Child's legal guardian:  PARENT  Guardian - Name Guardian - Age Guardian - Address  Moss,Dawn C 37 [address]  [place], [place] [address]  Moss, Dawn  same as above   Other household support members/support persons Other support:    II  PSYCHOSOCIAL DATA Information Source:    Financial and Community Resources Employment:   Spouse is employed   Financial resources:  Private Insurance If Medicaid - County:    School / Grade:   Maternity Care Coordinator / Child Services Coordination / Early Interventions:  Cultural issues impacting care:    III  STRENGTHS Strengths  Supportive family/friends  Home prepared for Child (including basic supplies)  Adequate Resources   Strength comment:    IV  RISK FACTORS AND CURRENT PROBLEMS Current Problem:       V  SOCIAL WORK ASSESSMENT Acknowledged order for Social Work consult to assess mother's history of depression.  Parents are married and have one other dependent age 2.  Mother acknowledged hx of depression.  Informed that she was going through a stressful time about 7 years ago and sought counseling, but was never prescribed medication.  Mother states that she does not believe it was a clinical depression.  Informed that she has not had any symptoms since, and denies current symptoms of depression or anxiety.  She denies any hx of substance abuse.   Discussed signs and symptoms of PP Depression.  Provided her with information and resources if needed.   No acute social concerns noted at this time. Mother informed of social work  availability.      VI SOCIAL WORK PLAN Social Work Plan  No Further Intervention Required / No Barriers to Discharge    

## 2013-12-20 NOTE — Progress Notes (Signed)
Patient ID: Dawn Moss, female   DOB: 02/26/1976, 38 y.o.   MRN: [id number] Subjective: POD# 2 Information for the patient's newborn:  Dawn Moss, Dawn Moss [[id number]][[id number]]  female    Reports feeling well Feeding: breast Patient reports tolerating PO.  Breast symptoms: none Pain controlled with ibuprofen (OTC) and narcotic analgesics including Percocet Denies HA/SOB/C/P/N/V/dizziness. Flatus present, no BM. She reports vaginal bleeding as normal, without clots.  She is ambulating, urinating without difficult.     Objective:   VS:  Filed Vitals:   12/19/13 0618 12/19/13 0920 12/19/13 1738 12/20/13 0555  BP: 100/51 101/64 102/51 115/73  Pulse: 72 86 79 79  Temp: 98 F (36.7 C) 98.5 F (36.9 C) 98.4 F (36.9 C) 98.1 F (36.7 C)  TempSrc: Oral Oral Oral Oral  Resp: 18 18 18 18   Height:      Weight:      SpO2: 96% 95%      No intake or output data in the 24 hours ending 12/20/13 1002       Recent Labs  12/19/13 0610  WBC 6.1  HGB 9.7*  HCT 29.1*  PLT 145*     Blood type: O POS, O POS (07/22 0945)  Rubella: Immune (12/23 0000)     Physical Exam:  General: alert, cooperative and no distress Abdomen: soft, nontender Incision: Tegaderm and Honeycomb dressings C/D/I Uterine Fundus: firm, 2 FB below umbilicus, nontender Lochia: minimal Ext: extremities normal, atraumatic, no cyanosis or edema, Homans sign is negative, no sign of DVT and no edema, redness or tenderness in the calves or thighs   Assessment/Plan: 38 y.o.   POD# 2. [id number] / S/P Cesarean Delivery.  Indications: malpresentation: [name] with failed ECV                Principal Problem:   Postpartum care following cesarean delivery (7/24)  Doing well, stable.               Regular diet as tolerated  Ambulate K-pad for back pain Routine post-op care  Early discharge tomorrow AM  Raelyn MoraAWSON, Voncile Schwarz, Judie PetitM, MSN, CNM 12/20/2013, 10:02 AM

## 2013-12-20 NOTE — Discharge Instructions (Signed)
Breast Pumping Tips °If you are breastfeeding, there may be times when you cannot feed your baby directly. Returning to work or going on a trip are common examples. Pumping allows you to store breast milk and feed it to your baby later.  °You may not get much milk when you first start to pump. Your breasts should start to make more after a few days. If you pump at the times you usually feed your baby, you may be able to keep making enough milk to feed your baby without also using formula. The more often you pump, the more milk you will produce.  °WHEN SHOULD I PUMP?  °· You can begin to pump soon after delivery. However, some experts recommend waiting about 4 weeks before giving your infant a bottle to make sure breastfeeding is going well.  °· If you plan to return to work, begin pumping a few weeks before. This will help you develop techniques that work best for you. It also lets you build up a supply of breast milk.   °· When you are with your infant, feed on demand and pump after each feeding.   °· When you are away from your infant for several hours, pump for about 15 minutes every 2-3 hours. Pump both breasts at the same time if you can.   °· If your infant has a formula feeding, make sure to pump around the same time.     °· If you drink any alcohol, wait 2 hours before pumping.   °HOW DO I PREPARE TO PUMP? °Your let-down reflex is the natural reaction to stimulation that makes your breast milk flow. It is easier to stimulate this reflex when you are relaxed. Find relaxation techniques that work for you. If you have difficulty with your let-down reflex, try these methods:  °· Smell one of your infant's blankets or an item of clothing.   °· Look at a picture or video of your infant.   °· Sit in a quiet, private space.   °· Massage the breast you plan to pump.   °· Place soothing warmth on the breast.   °· Play relaxing music.   °WHAT ARE SOME GENERAL BREAST PUMPING TIPS? °· Wash your hands before you pump. You  do not need to wash your nipples or breasts. °· There are three ways to pump. °¨ You can use your hand to massage and compress your breast. °¨ You can use a handheld manual pump. °¨ You can use an electric pump.   °· Make sure the suction cup (flange) on the breast pump is the right size. Place the flange directly over the nipple. If it is the wrong size or placed the wrong way, it may be painful and cause nipple damage.   °· If pumping is uncomfortable, apply a small amount of purified or modified lanolin to your nipple and areola. °· If you are using an electric pump, adjust the speed and suction power to be more comfortable. °· If pumping is painful or if you are not getting very much milk, you may need a different type of pump. A lactation consultant can help you determine what type of pump to use.   °· Keep a full water bottle near you at all times. Drinking lots of fluid helps you make more milk.  °· You can store your milk to use later. Pumped breast milk can be stored in a sealable, sterile container or plastic bag. Label all stored breast milk with the date you pumped it. °¨ Milk can stay out at room temperature for up to 8 hours. °¨   You can store your milk in the refrigerator for up to 8 days. °¨ You can store your milk in the freezer for 3 months. Thaw frozen milk using warm water. Do not put it in the microwave. °· Do not smoke. Smoking can lower your milk supply and harm your infant. If you need help quitting, ask your health care provider to recommend a program.   °WHEN SHOULD I CALL MY HEALTH CARE PROVIDER OR A LACTATION CONSULTANT? °· You are having trouble pumping. °· You are concerned that you are not making enough milk. °· You have nipple pain, soreness, or redness. °· You want to use birth control. Birth control pills may lower your milk supply. Talk to your health care provider about your options. °Document Released: 11/01/2009 Document Revised: 05/19/2013 Document Reviewed:  03/06/2013 °Exit[employer]® Patient Information ©2015 [employer]. This information is not intended to replace advice given to you by your health care provider. Make sure you discuss any questions you have with your health care provider. ° °Nutrition for the New Mother  °A new mother needs good health and nutrition so she can have energy to take care of a new baby. Whether a mother breastfeeds or formula feeds the baby, it is important to have a well-balanced diet. Foods from all the food groups should be chosen to meet the new mother's energy needs and to give her the nutrients needed for repair and healing.  °A HEALTHY EATING PLAN °The My [employer]ram[employer] plan for Moms outlines what you should eat to help you and your baby stay healthy. The energy and amount of food you need depends on whether or not you are breastfeeding. If you are breastfeeding you will need more nutrients. If you choose not to breastfeed, your nutrition goal should be to return to a healthy weight. Limiting calories may be needed if you are not breastfeeding.  °HOME CARE INSTRUCTIONS  °· For a personal plan based on your unique needs, see your Registered Dietitian or visit www.mypyramid.gov. °· Eat a variety of foods. The plan below will help guide you. The following chart has a suggested daily meal plan from the [employer] for Moms. °· Eat a variety of fruits and vegetables. °· Eat more dark green and orange vegetables and cooked dried beans. °· Make half your grains whole grains. Choose whole instead of refined grains. °· Choose low-fat or lean meats and poultry. °· Choose low-fat or fat-free dairy products like milk, cheese, or yogurt. °Fruits °· Breastfeeding: 2 cups °· Non-Breastfeeding: 2 cups °· What Counts as a serving? °¨ 1 cup of fruit or juice. °¨ ½ cup dried fruit. °Vegetables °· Breastfeeding: 3 cups °· Non-Breastfeeding: 2 ½ cups °· What Counts as a serving? °¨ 1 cup raw or cooked vegetables. °¨ Juice or 2 cups raw leafy  vegetables. °Grains °· Breastfeeding: 8 oz °· Non-Breastfeeding: 6 oz °· What Counts as a serving? °¨ 1 slice bread. °¨ 1 oz ready-to-eat cereal. °¨ ½ cup cooked pasta, rice, or cereal. °Meat and Beans °· Breastfeeding: 6 ½ oz °· Non-Breastfeeding: 5 ½ oz °· What Counts as a serving? °¨ 1 oz lean meat, poultry, or fish °¨ ¼ cup cooked dry beans °¨ ½ oz nuts or 1 egg °¨ 1 tbs peanut butter °Milk °· Breastfeeding: 3 cups °· Non-Breastfeeding: 3 cups °· What Counts as a serving? °¨ 1 cup milk. °¨ 8 oz yogurt. °¨ 1 ½ oz cheese. °¨ 2 oz processed cheese. °TIPS FOR THE BREASTFEEDING MOM °· Rapid weight   loss is not suggested when you are breastfeeding. By simply breastfeeding, you will be able to lose the weight gained during your pregnancy. Your caregiver can keep track of your weight and tell you if your weight loss is appropriate. °· Be sure to drink fluids. You may notice that you are thirstier than usual. A suggestion is to drink a glass of water or other beverage whenever you breastfeed. °· Avoid alcohol as it can be passed into your breast milk. °· Limit caffeine drinks to no more than 2 to 3 cups per day. °· You may need to keep taking your prenatal vitamin while you are breastfeeding. Talk with your caregiver about taking a vitamin or supplement. °RETURING TO A HEALTHY WEIGHT °· The My [employer] Plan for Moms will help you return to a healthy weight. It will also provide the nutrients you need. °· You may need to limit "empty" calories. These include: °¨ High fat foods like fried foods, fatty meats, fast food, butter, and mayonnaise. °¨ High sugar foods like sodas, jelly, candy, and sweets. °· Be physically active. Include 30 minutes of exercise or more each day. Choose an activity you like such as walking, swimming, biking, or aerobics. Check with your caregiver before you start to exercise. °Document Released: 08/21/2007 Document Revised: 08/06/2011 Document Reviewed: 08/21/2007 °ExitCare® Patient Information  ©2015 [employer]. This information is not intended to replace advice given to you by your health care provider. Make sure you discuss any questions you have with your health care provider. °Postpartum Depression and Baby Blues °The postpartum period begins right after the birth of a baby. During this time, there is often a great amount of joy and excitement. It is also a time of many changes in the life of the parents. Regardless of how many times a mother gives birth, each child brings new challenges and dynamics to the family. It is not unusual to have feelings of excitement along with confusing shifts in moods, emotions, and thoughts. All mothers are at risk of developing postpartum depression or the "baby blues." These mood changes can occur right after giving birth, or they may occur many months after giving birth. The baby blues or postpartum depression can be mild or severe. Additionally, postpartum depression can go away rather quickly, or it can be a long-term condition.  °CAUSES °Raised hormone levels and the rapid drop in those levels are thought to be a main cause of postpartum depression and the baby blues. A number of hormones change during and after pregnancy. Estrogen and progesterone usually decrease right after the delivery of your baby. The levels of thyroid hormone and various cortisol steroids also rapidly drop. Other factors that play a role in these mood changes include major life events and genetics.  °RISK FACTORS °If you have any of the following risks for the baby blues or postpartum depression, know what symptoms to watch out for during the postpartum period. Risk factors that may increase the likelihood of getting the baby blues or postpartum depression include: °· Having a personal or family history of depression.   °· Having depression while being pregnant.   °· Having premenstrual mood issues or mood issues related to oral contraceptives. °· Having a lot of life stress.   °· Having  marital conflict.   °· Lacking a social support network.   °· Having a baby with special needs.   °· Having health problems, such as diabetes.   °SIGNS AND SYMPTOMS °Symptoms of baby blues include: °· Brief changes in mood, such as going   from extreme happiness to sadness. °· Decreased concentration.   °· Difficulty sleeping.   °· Crying spells, tearfulness.   °· Irritability.   °· Anxiety.   °Symptoms of postpartum depression typically begin within the first month after giving birth. These symptoms include: °· Difficulty sleeping or excessive sleepiness.   °· Marked weight loss.   °· Agitation.   °· Feelings of worthlessness.   °· Lack of interest in activity or food.   °Postpartum psychosis is a very serious condition and can be dangerous. Fortunately, it is rare. Displaying any of the following symptoms is cause for immediate medical attention. Symptoms of postpartum psychosis include:  °· Hallucinations and delusions.   °· Bizarre or disorganized behavior.   °· Confusion or disorientation.   °DIAGNOSIS  °A diagnosis is made by an evaluation of your symptoms. There are no medical or lab tests that lead to a diagnosis, but there are various questionnaires that a health care provider may use to identify those with the baby blues, postpartum depression, or psychosis. Often, a screening tool called the Edinburgh Postnatal Depression Scale is used to diagnose depression in the postpartum period.  °TREATMENT °The baby blues usually goes away on its own in 1-2 weeks. Social support is often all that is needed. You will be encouraged to get adequate sleep and rest. Occasionally, you may be given medicines to help you sleep.  °Postpartum depression requires treatment because it can last several months or longer if it is not treated. Treatment may include individual or group therapy, medicine, or both to address any social, physiological, and psychological factors that may play a role in the depression. Regular exercise, a  healthy diet, rest, and social support may also be strongly recommended.  °Postpartum psychosis is more serious and needs treatment right away. Hospitalization is often needed. °HOME CARE INSTRUCTIONS °· Get as much rest as you can. Nap when the baby sleeps.   °· Exercise regularly. Some women find yoga and walking to be beneficial.   °· Eat a balanced and nourishing diet.   °· Do little things that you enjoy. Have a cup of tea, take a bubble bath, read your favorite magazine, or listen to your favorite music. °· Avoid alcohol.   °· Ask for help with household chores, cooking, grocery shopping, or running errands as needed. Do not try to do everything.   °· Talk to people close to you about how you are feeling. Get support from your partner, family members, friends, or other new moms. °· Try to stay positive in how you think. Think about the things you are grateful for.   °· Do not spend a lot of time alone.   °· Only take over-the-counter or prescription medicine as directed by your health care provider. °· Keep all your postpartum appointments.   °· Let your health care provider know if you have any concerns.   °SEEK MEDICAL CARE IF: °You are having a reaction to or problems with your medicine. °SEEK IMMEDIATE MEDICAL CARE IF: °· You have suicidal feelings.   °· You think you may harm the baby or someone else. °MAKE SURE YOU: °· Understand these instructions. °· Will watch your condition. °· Will get help right away if you are not doing well or get worse. °Document Released: 02/16/2004 Document Revised: 05/19/2013 Document Reviewed: 02/23/2013 °Exit[employer]® Patient Information ©2015 [employer]. This information is not intended to replace advice given to you by your health care provider. Make sure you discuss any questions you have with your health care provider. °Breastfeeding and Mastitis °Mastitis is inflammation of the breast tissue. It can occur in women who   are breastfeeding. This can make breastfeeding  painful. Mastitis will sometimes go away on its own. Your health care provider will help determine if treatment is needed. °CAUSES °Mastitis is often associated with a blocked milk (lactiferous) duct. This can happen when too much milk builds up in the breast. Causes of excess milk in the breast can include: °· Poor latch-on. If your baby is not latched onto the breast properly, she or he may not empty your breast completely while breastfeeding. °· Allowing too much time to pass between feedings. °· Wearing a bra or other clothing that is too tight. This puts extra pressure on the lactiferous ducts so milk does not flow through them as it should. °Mastitis can also be caused by a bacterial infection. Bacteria may enter the breast tissue through cuts or openings in the skin. In women who are breastfeeding, this may occur because of cracked or irritated skin. Cracks in the skin are often caused when your baby does not latch on properly to the breast. °SIGNS AND SYMPTOMS °· Swelling, redness, tenderness, and pain in an area of the breast. °· Swelling of the glands under the arm on the same side. °· Fever may or may not accompany mastitis. °If an infection is allowed to progress, a collection of pus (abscess) may develop. °DIAGNOSIS  °Your health care provider can usually diagnose mastitis based on your symptoms and a physical exam. Tests may be done to help confirm the diagnosis. These may include: °· Removal of pus from the breast by applying pressure to the area. This pus can be examined in the lab to determine which bacteria are present. If an abscess has developed, the fluid in the abscess can be removed with a needle. This can also be used to confirm the diagnosis and determine the bacteria present. In most cases, pus will not be present. °· Blood tests to determine if your body is fighting a bacterial infection. °· Mammogram or ultrasound tests to rule out other problems or diseases. °TREATMENT  °Mastitis that  occurs with breastfeeding will sometimes go away on its own. Your health care provider may choose to wait 24 hours after first seeing you to decide whether a prescription medicine is needed. If your symptoms are worse after 24 hours, your health care provider will likely prescribe an antibiotic medicine to treat the mastitis. He or she will determine which bacteria are most likely causing the infection and will then select an appropriate antibiotic medicine. This is sometimes changed based on the results of tests performed to identify the bacteria, or if there is no response to the antibiotic medicine selected. Antibiotic medicines are usually given by mouth. You may also be given medicine for pain. °HOME CARE INSTRUCTIONS °· Only take over-the-counter or prescription medicines for pain, fever, or discomfort as directed by your health care provider. °· If your health care provider prescribed an antibiotic medicine, take the medicine as directed. Make sure you finish it even if you start to feel better. °· Do not wear a tight or underwire bra. Wear a soft, supportive bra. °· Increase your fluid intake, especially if you have a fever. °· Continue to empty the breast. Your health care provider can tell you whether this milk is safe for your infant or needs to be thrown out. You may be told to stop nursing until your health care provider thinks it is safe for your baby. Use a breast pump if you are advised to stop nursing. °· Keep your nipples   clean and dry. °· Empty the first breast completely before going to the other breast. If your baby is not emptying your breasts completely for some reason, use a breast pump to empty your breasts. °· If you go back to work, pump your breasts while at work to stay in time with your nursing schedule. °· Avoid allowing your breasts to become overly filled with milk (engorged). °SEEK MEDICAL CARE IF: °· You have pus-like discharge from the breast. °· Your symptoms do not improve with  the treatment prescribed by your health care provider within 2 days. °SEEK IMMEDIATE MEDICAL CARE IF: °· Your pain and swelling are getting worse. °· You have pain that is not controlled with medicine. °· You have a red line extending from the breast toward your armpit. °· You have a fever or persistent symptoms for more than 2-3 days. °· You have a fever and your symptoms suddenly get worse. °MAKE SURE YOU:  °· Understand these instructions. °· Will watch your condition. °· Will get help right away if you are not doing well or get worse. °Document Released: 09/08/2004 Document Revised: 05/19/2013 Document Reviewed: 12/18/2012 °[employer]® Patient Information ©2015 [employer]. This information is not intended to replace advice given to you by your health care provider. Make sure you discuss any questions you have with your health care provider. °Breastfeeding °Deciding to breastfeed is one of the best choices you can make for you and your baby. A change in hormones during pregnancy causes your breast tissue to grow and increases the number and size of your milk ducts. These hormones also allow proteins, sugars, and fats from your blood supply to make breast milk in your milk-producing glands. Hormones prevent breast milk from being released before your baby is born as well as prompt milk flow after birth. Once breastfeeding has begun, thoughts of your baby, as well as his or her sucking or crying, can stimulate the release of milk from your milk-producing glands.  °BENEFITS OF BREASTFEEDING °For Your Baby °· Your first milk (colostrum) helps your baby's digestive system function better.   °· There are antibodies in your milk that help your baby fight off infections.   °· Your baby has a lower incidence of asthma, allergies, and sudden infant death syndrome.   °· The nutrients in breast milk are better for your baby than infant formulas and are designed uniquely for your baby's needs.   °· Breast milk improves your  baby's brain development.   °· Your baby is less likely to develop other conditions, such as childhood obesity, asthma, or type 2 diabetes mellitus.   °For You  °· Breastfeeding helps to create a very special bond between you and your baby.   °· Breastfeeding is convenient. Breast milk is always available at the correct temperature and costs nothing.   °· Breastfeeding helps to burn calories and helps you lose the weight gained during pregnancy.   °· Breastfeeding makes your uterus contract to its prepregnancy size faster and slows bleeding (lochia) after you give birth.   °· Breastfeeding helps to lower your risk of developing type 2 diabetes mellitus, osteoporosis, and breast or ovarian cancer later in life. °SIGNS THAT YOUR BABY IS HUNGRY °Early Signs of Hunger  °· Increased alertness or activity. °· Stretching. °· Movement of the head from side to side. °· Movement of the head and opening of the mouth when the corner of the mouth or cheek is stroked (rooting). °· Increased sucking sounds, smacking lips, cooing, sighing, or squeaking. °· Hand-to-mouth movements. °· Increased sucking of   fingers or hands. °Late Signs of Hunger °· Fussing. °· Intermittent crying. °Extreme Signs of Hunger °Signs of extreme hunger will require calming and consoling before your baby will be able to breastfeed successfully. Do not wait for the following signs of extreme hunger to occur before you initiate breastfeeding:   °· Restlessness. °· A loud, strong cry. °·  Screaming. °BREASTFEEDING BASICS °Breastfeeding Initiation °· Find a comfortable place to sit or lie down, with your neck and back well supported. °· Place a pillow or rolled up blanket under your baby to bring him or her to the level of your breast (if you are seated). Nursing pillows are specially designed to help support your arms and your baby while you breastfeed. °· Make sure that your baby's abdomen is facing your abdomen.   °· Gently massage your breast. With your  fingertips, massage from your chest wall toward your nipple in a circular motion. This encourages milk flow. You may need to continue this action during the feeding if your milk flows slowly. °· Support your breast with 4 fingers underneath and your thumb above your nipple. Make sure your fingers are well away from your nipple and your baby's mouth.   °· Stroke your baby's lips gently with your finger or nipple.   °· When your baby's mouth is open wide enough, quickly bring your baby to your breast, placing your entire nipple and as much of the colored area around your nipple (areola) as possible into your baby's mouth.   °¨ More areola should be visible above your baby's upper lip than below the lower lip.   °¨ Your baby's tongue should be between his or her lower gum and your breast.   °· Ensure that your baby's mouth is correctly positioned around your nipple (latched). Your baby's lips should create a seal on your breast and be turned out (everted). °· It is common for your baby to suck about 2-3 minutes in order to start the flow of breast milk. °Latching °Teaching your baby how to latch on to your breast properly is very important. An improper latch can cause nipple pain and decreased milk supply for you and poor weight gain in your baby. Also, if your baby is not latched onto your nipple properly, he or she may swallow some air during feeding. This can make your baby fussy. Burping your baby when you switch breasts during the feeding can help to get rid of the air. However, teaching your baby to latch on properly is still the best way to prevent fussiness from swallowing air while breastfeeding. °Signs that your baby has successfully latched on to your nipple:    °· Silent tugging or silent sucking, without causing you pain.   °· Swallowing heard between every 3-4 sucks.   °·  Muscle movement above and in front of his or her ears while sucking.   °Signs that your baby has not successfully latched on to  nipple:  °· Sucking sounds or smacking sounds from your baby while breastfeeding. °· Nipple pain. °If you think your baby has not latched on correctly, slip your finger into the corner of your baby's mouth to break the suction and place it between your baby's gums. Attempt breastfeeding initiation again. °Signs of Successful Breastfeeding °Signs from your baby:   °· A gradual decrease in the number of sucks or complete cessation of sucking.   °· Falling asleep.   °· Relaxation of his or her body.   °· Retention of a small amount of milk in his or her mouth.   °· Letting go   of your breast by himself or herself. °Signs from you: °· Breasts that have increased in firmness, weight, and size 1-3 hours after feeding.   °· Breasts that are softer immediately after breastfeeding. °· Increased milk volume, as well as a change in milk consistency and color by the fifth day of breastfeeding.   °· Nipples that are not sore, cracked, or bleeding. °Signs That Your Baby is Getting Enough Milk °· Wetting at least 3 diapers in a 24-hour period. The urine should be clear and pale yellow by age 5 days. °· At least 3 stools in a 24-hour period by age 5 days. The stool should be soft and yellow. °· At least 3 stools in a 24-hour period by age 7 days. The stool should be seedy and yellow. °· No loss of weight greater than 10% of birth weight during the first 3 days of age. °· Average weight gain of 4-7 ounces (113-198 g) per week after age 4 days. °· Consistent daily weight gain by age 5 days, without weight loss after the age of 2 weeks. °After a feeding, your baby may spit up a small amount. This is common. °BREASTFEEDING FREQUENCY AND DURATION °Frequent feeding will help you make more milk and can prevent sore nipples and breast engorgement. Breastfeed when you feel the need to reduce the fullness of your breasts or when your baby shows signs of hunger. This is called "breastfeeding on demand." Avoid introducing a pacifier to your  baby while you are working to establish breastfeeding (the first 4-6 weeks after your baby is born). After this time you may choose to use a pacifier. Research has shown that pacifier use during the first year of a baby's life decreases the risk of sudden infant death syndrome (SIDS). °Allow your baby to feed on each breast as long as he or she wants. Breastfeed until your baby is finished feeding. When your baby unlatches or falls asleep while feeding from the first breast, offer the second breast. Because newborns are often sleepy in the first few weeks of life, you may need to awaken your baby to get him or her to feed. °Breastfeeding times will vary from baby to baby. However, the following rules can serve as a guide to help you ensure that your baby is properly fed: °· Newborns (babies 4 weeks of age or younger) may breastfeed every 1-3 hours. °· Newborns should not go longer than 3 hours during the day or 5 hours during the night without breastfeeding. °· You should breastfeed your baby a minimum of 8 times in a 24-hour period until you begin to introduce solid foods to your baby at around 6 months of age. °BREAST MILK PUMPING °Pumping and storing breast milk allows you to ensure that your baby is exclusively fed your breast milk, even at times when you are unable to breastfeed. This is especially important if you are going back to work while you are still breastfeeding or when you are not able to be present during feedings. Your lactation consultant can give you guidelines on how long it is safe to store breast milk.  °A breast pump is a machine that allows you to pump milk from your breast into a sterile bottle. The pumped breast milk can then be stored in a refrigerator or freezer. Some breast pumps are operated by hand, while others use electricity. Ask your lactation consultant which type will work best for you. Breast pumps can be purchased, but some hospitals and breastfeeding support groups   lease  breast pumps on a monthly basis. A lactation consultant can teach you how to hand express breast milk, if you prefer not to use a pump.  °CARING FOR YOUR BREASTS WHILE YOU BREASTFEED °Nipples can become dry, cracked, and sore while breastfeeding. The following recommendations can help keep your breasts moisturized and healthy: °· Avoid using soap on your nipples.   °· Wear a supportive bra. Although not required, special nursing bras and tank tops are designed to allow access to your breasts for breastfeeding without taking off your entire bra or top. Avoid wearing underwire-style bras or extremely tight bras. °· Air dry your nipples for 3-4 minutes after each feeding.   °· Use only cotton bra pads to absorb leaked breast milk. Leaking of breast milk between feedings is normal.   °· Use lanolin on your nipples after breastfeeding. Lanolin helps to maintain your skin's normal moisture barrier. If you use pure lanolin, you do not need to wash it off before feeding your baby again. Pure lanolin is not toxic to your baby. You may also hand express a few drops of breast milk and gently massage that milk into your nipples and allow the milk to air dry. °In the first few weeks after giving birth, some women experience extremely full breasts (engorgement). Engorgement can make your breasts feel heavy, warm, and tender to the touch. Engorgement peaks within 3-5 days after you give birth. The following recommendations can help ease engorgement: °· Completely empty your breasts while breastfeeding or pumping. You may want to start by applying warm, moist heat (in the shower or with warm water-soaked hand towels) just before feeding or pumping. This increases circulation and helps the milk flow. If your baby does not completely empty your breasts while breastfeeding, pump any extra milk after he or she is finished. °· Wear a snug bra (nursing or regular) or tank top for 1-2 days to signal your body to slightly decrease milk  production. °· Apply ice packs to your breasts, unless this is too uncomfortable for you. °· Make sure that your baby is latched on and positioned properly while breastfeeding. °If engorgement persists after 48 hours of following these recommendations, contact your health care provider or a lactation consultant. °OVERALL HEALTH CARE RECOMMENDATIONS WHILE BREASTFEEDING °· Eat healthy foods. Alternate between meals and snacks, eating 3 of each per day. Because what you eat affects your breast milk, some of the foods may make your baby more irritable than usual. Avoid eating these foods if you are sure that they are negatively affecting your baby. °· Drink milk, fruit juice, and water to satisfy your thirst (about 10 glasses a day).   °· Rest often, relax, and continue to take your prenatal vitamins to prevent fatigue, stress, and anemia. °· Continue breast self-awareness checks. °· Avoid chewing and smoking tobacco. °· Avoid alcohol and drug use. °Some medicines that may be harmful to your baby can pass through breast milk. It is important to ask your health care provider before taking any medicine, including all over-the-counter and prescription medicine as well as vitamin and herbal supplements. °It is possible to become pregnant while breastfeeding. If birth control is desired, ask your health care provider about options that will be safe for your baby. °SEEK MEDICAL CARE IF:  °· You feel like you want to stop breastfeeding or have become frustrated with breastfeeding. °· You have painful breasts or nipples. °· Your nipples are cracked or bleeding. °· Your breasts are red, tender, or warm. °· You have   a swollen area on either breast. °· You have a fever or chills. °· You have nausea or vomiting. °· You have drainage other than breast milk from your nipples. °· Your breasts do not become full before feedings by the fifth day after you give birth. °· You feel sad and depressed. °· Your baby is too sleepy to eat  well. °· Your baby is having trouble sleeping.   °· Your baby is wetting less than 3 diapers in a 24-hour period. °· Your baby has less than 3 stools in a 24-hour period. °· Your baby's skin or the white part of his or her eyes becomes yellow.   °· Your baby is not gaining weight by 5 days of age. °SEEK IMMEDIATE MEDICAL CARE IF:  °· Your baby is overly tired (lethargic) and does not want to wake up and feed. °· Your baby develops an unexplained fever. °Document Released: 05/14/2005 Document Revised: 05/19/2013 Document Reviewed: 11/05/2012 °ExitCare® Patient Information ©2015 [employer]. This information is not intended to replace advice given to you by your health care provider. Make sure you discuss any questions you have with your health care provider. ° °

## 2013-12-20 NOTE — Discharge Summary (Signed)
POSTOPERATIVE DISCHARGE SUMMARY:  Patient ID: Dawn Moss MRN: [id number] DOB/AGE: 37/06/1975 37 y.o.  Admit date: 12/18/2013 Admission Diagnoses: 39.2. wks persistent breech presentation and failed  external cephalic version, for primary C-section   Discharge date:   Discharge Diagnoses: S/P Primary C/S due to persistent breech presentation and failed  external cephalic version on 12/18/2013        Prenatal history: G2P2002   EDC : 12/25/2013, by Ultrasound Has [facility] since 8.[redacted] wks gestation. Primary provider : Dr. Billy Coastaavon Prenatal course complicated by frank breech, failed ECV  Prenatal Labs: ABO, Rh: O POS (07/22 0945) Antibody: NEG (07/22 0945) Rubella: Immune (12/23 0000)  RPR: NON REAC (07/22 0945)  HBsAg: Negative (12/23 0000)  HIV: Non-reactive (12/23 0000)  GTT : normal GBS: Negative (07/02 0000)   Medical / Surgical History :  Past medical history:  Past Medical History  Diagnosis Date  . Depression   . Hypothyroidism   . Family history of malignant neoplasm of breast   . Infertility, female   . HYQMVHQI(696.2Headache(784.0)     Past surgical history:  Past Surgical History  Procedure Laterality Date  . Tonsillectomy      1997  . Knee surgery       Allergies: Shellfish allergy   Physical Exam:   VSS: Blood pressure 115/73, pulse 79, temperature 98.1 F (36.7 C), temperature source Oral, resp. rate 18, height 5\' 4"  (1.626 m), weight 79.833 kg (176 lb), last menstrual period 03/22/2013, SpO2 95.00%.  LABS:  Recent Labs  12/19/13 0610  WBC 6.1  HGB 9.7*  PLT 145*    Newborn Data Live born female on 12/18/2013 Birth Weight: 6 lb 13 oz (3090 g) APGAR: 9, 9  See operative report for further details  Home with mother.  Discharge Instructions:  Wound Care: keep clean and dry / remove honeycomb POD 7 Postpartum Instructions: [facility] discharge booklet - instructions reviewed Medications:    Medication  List         ibuprofen 600 MG tablet  Commonly known as:  ADVIL,MOTRIN  Take 1 tablet (600 mg total) by mouth every 6 (six) hours.     levothyroxine 25 MCG tablet  Commonly known as:  SYNTHROID, LEVOTHROID  Take 25 mcg by mouth daily before breakfast.     loratadine 10 MG tablet  Commonly known as:  CLARITIN  Take 10 mg by mouth daily.     ondansetron 4 MG tablet  Commonly known as:  ZOFRAN  Take 1 tablet (4 mg total) by mouth every 4 (four) hours as needed for nausea.     oxyCODONE-acetaminophen 5-325 MG per tablet  Commonly known as:  PERCOCET/ROXICET  Take 1-2 tablets by mouth every 4 (four) hours as needed for severe pain (moderate - severe pain).     prenatal multivitamin Tabs tablet  Take 1 tablet by mouth daily at 12 noon.     psyllium 58.6 % packet  Commonly known as:  METAMUCIL  Take 1 packet by mouth daily.           Follow-up Information   Follow up with Lenoard AdenAAVON,RICHARD J, MD. Schedule an appointment as soon as possible for a visit in 6 weeks. (For postpartum visit)    Specialty:  Obstetrics and Gynecology   Contact information:   [address] [place] [id number] [phone]         Signed: Raelyn MoraAWSON, Deno Sida, Judie PetitM, MSN, CNM 12/20/2013, 10:12 AM

## 2013-12-20 NOTE — Lactation Note (Signed)
This note was copied from the chart of Dawn Moss. Lactation Consultation Note  Patient Name: Dawn Trenton GammonMary Boomershine WUJWJ'XToday's Date: 12/20/2013 Reason for consult: Follow-up assessment Per mom breast feeding is going well , and breast are feeling fuller , and nipples are alittle tender. LC assessed breast tissue with moms permission, noted some areola edema, and breast feeding,  No skin breakdown noted , Instructed mom on use comfort gels and breast shells as a preventive measure  Against sore ness. Per mom has a DEBP Medela at home . Mom had requested the [facility] RN to set up a DEBP  Yesterday and per mom has not used it , she had a desire to know how to use it.  Engorgement prevention and tx reviewed, referring to the Baby and me booklet. Both mom and dad receptive to teaching.  Mother informed of post-discharge support and given phone number to the lactation department, including services for phone  call assistance; out-patient appointments; and breastfeeding support group. List of other breastfeeding resources in  the community given in the handout. Encouraged mother to call for problems or concerns related to breastfeeding.    Maternal Data Formula Feeding for Exclusion: No  Feeding Feeding Type:  (per mom recently fed ) Length of feed: 15 min  LATCH Score/Interventions Latch: Grasps breast easily, tongue down, lips flanged, rhythmical sucking.  Audible Swallowing: Spontaneous and intermittent  Type of Nipple: Everted at rest and after stimulation  Comfort (Breast/Nipple): Soft / non-tender     Hold (Positioning): No assistance needed to correctly position infant at breast. Intervention(s): Breastfeeding basics reviewed (see LC note )  LATCH Score: 10  Lactation Tools Discussed/Used Breast pump type: Double-Electric Breast Pump (mom had requested the DEBP yesterday )   Consult Status Consult Status: Complete Date: 12/20/13    Kathrin Greathouseorio, Ercell Razon Ann 12/20/2013, 11:52  AM

## 2013-12-21 ENCOUNTER — Encounter (HOSPITAL_COMMUNITY): Payer: Self-pay | Admitting: Obstetrics and Gynecology

## 2013-12-24 NOTE — Discharge Summary (Signed)
Reviewed and agree with note and plan. V.Maikol Grassia, MD  

## 2014-02-03 ENCOUNTER — Telehealth: Payer: Self-pay | Admitting: Family Medicine

## 2014-02-03 NOTE — Telephone Encounter (Signed)
Pt called in and was requesting to change MD's. Was told previously that Dr Selena Batten did not not like to Rx for ADD meds. She was on them in the past but has been off of them for over a year due to pregnancy. Was happy under her care but wants to restart her meds. Please advise if she should change MD's or if she can keep Dr Selena Batten. I am happy to schedule her if so

## 2014-02-04 NOTE — Telephone Encounter (Signed)
Went to call pt to schedule and Dawn Moss had taken care of her.

## 2014-02-04 NOTE — Telephone Encounter (Signed)
She has not been in to see [place] in some time. She is welcome to schedule an appt to discuss he symptoms and find out what is needed.

## 2014-02-08 ENCOUNTER — Ambulatory Visit (INDEPENDENT_AMBULATORY_CARE_PROVIDER_SITE_OTHER): Payer: Commercial Managed Care - PPO | Admitting: Family Medicine

## 2014-02-08 ENCOUNTER — Encounter: Payer: Self-pay | Admitting: Family Medicine

## 2014-02-08 ENCOUNTER — Telehealth: Payer: Self-pay | Admitting: Family Medicine

## 2014-02-08 VITALS — BP 100/72 | HR 82 | Temp 97.9°F | Ht 64.0 in | Wt 158.0 lb

## 2014-02-08 DIAGNOSIS — F32A Depression, unspecified: Secondary | ICD-10-CM

## 2014-02-08 DIAGNOSIS — F3289 Other specified depressive episodes: Secondary | ICD-10-CM

## 2014-02-08 DIAGNOSIS — F329 Major depressive disorder, single episode, unspecified: Secondary | ICD-10-CM

## 2014-02-08 MED ORDER — CITALOPRAM HYDROBROMIDE 20 MG PO TABS: 20.0000 mg | ORAL_TABLET | Freq: Every day | ORAL | Status: DC

## 2014-02-08 NOTE — Telephone Encounter (Signed)
Rx sent. Follow up in 2-4 weeks.

## 2014-02-08 NOTE — Progress Notes (Signed)
Pre visit review using our clinic review tool, if applicable. No additional management support is needed unless otherwise documented below in the visit note. 

## 2014-02-08 NOTE — Progress Notes (Signed)
No chief complaint on file.   HPI:  Had little girl [name] in July, 2015. Also has 37 year old.  Acute visit for:  1)Depression: -hx depression and hypothyroidism --reports: since delivery has had some depressed mood and feeling overwhelmed, irritable --hx of ADD dx as adult, dx by PA, saw psychologist, was on Adderall in the past - but hated taking a stimulant - then was on celexa and did great --she is breast feeding -denies:SI, thoughts of self harm Sleep disorder: breast feeding Interest deficit/anhedonia: yes Guilt (worthlessness, hopelessness, regret): no Energy deficit: yes Concentration deficit: yes Appetite disorder: no Psychomotor retardation or agitation: yes Suicidality:  no   ROS: See pertinent positives and negatives per HPI.  Past Medical History  Diagnosis Date  . Depression   . Hypothyroidism   . Family history of malignant neoplasm of breast   . Infertility, female   . ZOXWRUEA(540.9)     Past Surgical History  Procedure Laterality Date  . Tonsillectomy      1997  . Knee surgery    . Cesarean section N/A 12/18/2013    Procedure: Primary CESAREAN SECTION;  Surgeon: Lenoard Aden, MD;  Location: [facility];  Service: Obstetrics;  Laterality: N/A;    Family History  Problem Relation Age of Onset  . Crohn's disease Mother   . Depression Mother   . Hypertension Father   . Cancer Maternal Grandmother     breast cancer  . Heart disease Maternal Grandfather   . Diabetes Paternal Grandfather   . Heart disease Paternal Grandfather     History   Social History  . Marital Status: Married    Spouse Name: N/A    Number of Children: N/A  . Years of Education: N/A   Social History Main Topics  . Smoking status: Former Smoker    Quit date: 10/07/2010  . Smokeless tobacco: Never Used  . Alcohol Use: Yes     Comment: glass or 2 of wine per week  . Drug Use: No  . Sexual Activity: Yes   Other Topics Concern  . 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; doing weight watchers             Current outpatient prescriptions:FENUGREEK PO, Take by mouth., Disp: , Rfl: ;  levothyroxine (SYNTHROID, LEVOTHROID) 25 MCG tablet, Take 25 mcg by mouth daily before breakfast., Disp: , Rfl: ;  loratadine (CLARITIN) 10 MG tablet, Take 10 mg by mouth daily., Disp: , Rfl: ;  Prenatal Vit-Fe Fumarate-FA (PRENATAL MULTIVITAMIN) TABS tablet, Take 1 tablet by mouth daily at 12 noon., Disp: , Rfl:  psyllium (METAMUCIL) 58.6 % packet, Take 1 packet by mouth daily., Disp: , Rfl:   EXAM:  Filed Vitals:   02/08/14 0926  BP: 100/72  Pulse: 82  Temp: 97.9 F (36.6 C)    Body mass index is 27.11 kg/(m^2).  GENERAL: vitals reviewed and listed above, alert, oriented, appears well hydrated and in no acute distress  HEENT: atraumatic, conjunttiva clear, no obvious abnormalities on inspection of external nose and ears  NECK: no obvious masses on inspection  LUNGS: clear to auscultation bilaterally, no wheezes, rales or rhonchi, good air movement  CV: HRRR, no peripheral edema  MS: moves all extremities without noticeable abnormality  PSYCH: pleasant and cooperative, no obvious depression or anxiety  ASSESSMENT AND PLAN:  Discussed the following assessment and plan:  Depression  Lactating mother  -discussed treatment options for depression -she opted for non-pharmocalogic tx and wants to check with ob before doing celexa -she will call if wants to start celexa -Patient advised to return or notify a doctor immediately if symptoms worsen or persist or new concerns arise.  Patient Instructions  -exercise daily  -meditation and or yoga to improve focus  -prayer   -counseling  -talk to obstetrician about the  celexa and let me know if you want to do this  -follow up in 3 months     Keaundra Stehle R.

## 2014-02-08 NOTE — Patient Instructions (Addendum)
-  exercise daily  -meditation and or yoga to improve focus  -prayer   -counseling  -talk to obstetrician about the celexa and let me know if you want to do this  -follow up in 3 weeks

## 2014-02-08 NOTE — Telephone Encounter (Signed)
Patient informed and appt scheduled for 10/16 at 9:15am.

## 2014-02-08 NOTE — Telephone Encounter (Signed)
Pt called back and said she would like to start celexa  Pt would like a call back when rx has been called in    Pharmacy [facility] on [address]

## 2014-03-12 ENCOUNTER — Encounter: Payer: Self-pay | Admitting: Family Medicine

## 2014-03-12 ENCOUNTER — Ambulatory Visit (INDEPENDENT_AMBULATORY_CARE_PROVIDER_SITE_OTHER): Payer: Commercial Managed Care - PPO | Admitting: Family Medicine

## 2014-03-12 VITALS — BP 102/70 | Temp 98.4°F | Wt 150.0 lb

## 2014-03-12 DIAGNOSIS — F32A Depression, unspecified: Secondary | ICD-10-CM | POA: Insufficient documentation

## 2014-03-12 DIAGNOSIS — E039 Hypothyroidism, unspecified: Secondary | ICD-10-CM

## 2014-03-12 DIAGNOSIS — F329 Major depressive disorder, single episode, unspecified: Secondary | ICD-10-CM

## 2014-03-12 DIAGNOSIS — Z23 Encounter for immunization: Secondary | ICD-10-CM

## 2014-03-12 DIAGNOSIS — M546 Pain in thoracic spine: Secondary | ICD-10-CM | POA: Insufficient documentation

## 2014-03-12 NOTE — Progress Notes (Signed)
Pre visit review using our clinic review tool, if applicable. No additional management support is needed unless otherwise documented below in the visit note. 

## 2014-03-12 NOTE — Progress Notes (Signed)
Chief Complaint  Patient presents with  . Follow-up    HPI:  1)Depression:  -she started celexa 01/2014 -hx depression and hypothyroidism  -reports: since delivery of 2nd child July 2015, symptoms resolved -hx of ADD dx as adult, dx by PA, saw psychologist, was on Adderall in the past - but hated taking a stimulant - then was on celexa and did great  -she is breast feeding, her obstetrician approved taking celexa while lactating -denies:SI, thoughts of self harm  Sleep disorder: breast feeding, doing great Interest deficit/anhedonia: doing well Guilt (worthlessness, hopelessness, regret): no  Energy deficit: improved Concentration deficit: improved Appetite disorder: no  Psychomotor retardation or agitation: no Suicidality: no  2)Hypothyroid: -TSH check 6 weeks ago by gyn pe rher report - managed by gyn  3) Back Pain: -started in last few months -muscle pain with lifting child and large breast, hurts after long day and certain movements -denies: trauma, malaise, fevers  ROS: See pertinent positives and negatives per HPI.  Past Medical History  Diagnosis Date  . Depression   . Hypothyroidism   . Family history of malignant neoplasm of breast   . Infertility, female   . ZOXWRUEA(540.9Headache(784.0)     Past Surgical History  Procedure Laterality Date  . Tonsillectomy      1997  . Knee surgery    . Cesarean section N/A 12/18/2013    Procedure: Primary CESAREAN SECTION;  Surgeon: Lenoard Adenichard J Taavon, MD;  Location: [facility];  Service: Obstetrics;  Laterality: N/A;    Family History  Problem Relation Age of Onset  . Crohn's disease Mother   . Depression Mother   . Hypertension Father   . Cancer Maternal Grandmother     breast cancer  . Heart disease Maternal Grandfather   . Diabetes Paternal Grandfather   . Heart disease Paternal Grandfather     History   Social History  . Marital Status: Married    Spouse Name: N/A    Number of Children: N/A  . Years of Education: N/A    Social History Main Topics  . Smoking status: Former Smoker    Quit date: 10/07/2010  . Smokeless tobacco: Never Used  . Alcohol Use: Yes     Comment: glass or 2 of wine per week  . Drug Use: No  . Sexual Activity: Yes   Other Topics Concern  . 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; doing weight watchers             Current outpatient prescriptions:citalopram (CELEXA) 20 MG tablet, Take 1 tablet (20 mg total) by mouth daily., Disp: 30 tablet, Rfl: 2;  FENUGREEK PO, Take by mouth., Disp: , Rfl: ;  levothyroxine (SYNTHROID, LEVOTHROID) 25 MCG tablet, Take 25 mcg by mouth daily before breakfast., Disp: , Rfl: ;  loratadine (CLARITIN) 10 MG tablet, Take 10 mg by mouth daily., Disp: , Rfl:  Prenatal Vit-Fe Fumarate-FA (PRENATAL MULTIVITAMIN) TABS tablet, Take 1 tablet by mouth daily at 12 noon., Disp: , Rfl: ;  psyllium (METAMUCIL) 58.6 % packet, Take 1 packet by mouth daily., Disp: , Rfl:   EXAM:  Filed Vitals:   03/12/14 0922  BP: 102/70  Temp: 98.4  F (36.9 C)    Body mass index is 25.73 kg/(m^2).  GENERAL: vitals reviewed and listed above, alert, oriented, appears well hydrated and in no acute distress  HEENT: atraumatic, conjunttiva clear, no obvious abnormalities on inspection of external nose and ears  NECK: no obvious masses on inspection  LUNGS: clear to auscultation bilaterally, no wheezes, rales or rhonchi, good air movement  CV: HRRR, no peripheral edema  MS: moves all extremities without noticeable abnormality, normal appearance of back, TTP in paraspinal muscles bilat lower thoracic and upper lumbar region, no bony TTP  PSYCH: pleasant and cooperative, no obvious depression or anxiety  ASSESSMENT AND PLAN:  Discussed  the following assessment and plan:  Bilateral thoracic back pain -likely muscular, discussed treatment, HEP provided and advised follow up in 1 month  Depression -improved, continue current tx, follow up 3 months  Hypothyroidism, unspecified hypothyroidism type -stable  -Patient advised to return or notify a doctor immediately if symptoms worsen or persist or new concerns arise.  Patient Instructions  Continue medication - follow up in 3 months - don't stop the medication suddenly  Do the exercises for the back and follow up in 1 month if not doing significantly better or persists  We recommend the following healthy lifestyle measures: - eat a healthy diet consisting of lots of vegetables, fruits, beans, nuts, seeds, healthy meats such as white chicken and fish and whole grains.  - avoid fried foods, fast food, processed foods, sodas, red meet and other fattening foods.  - get a least 150 minutes of aerobic exercise per week.       Kriste BasqueKIM, HANNAH R.

## 2014-03-12 NOTE — Patient Instructions (Addendum)
Continue medication - follow up in 3 months - don't stop the medication suddenly  Do the exercises for the back and follow up in 1 month if not doing significantly better or persists  We recommend the following healthy lifestyle measures: - eat a healthy diet consisting of lots of vegetables, fruits, beans, nuts, seeds, healthy meats such as white chicken and fish and whole grains.  - avoid fried foods, fast food, processed foods, sodas, red meet and other fattening foods.  - get a least 150 minutes of aerobic exercise per week.

## 2014-03-29 ENCOUNTER — Encounter: Payer: Self-pay | Admitting: Family Medicine

## 2014-04-24 ENCOUNTER — Other Ambulatory Visit: Payer: Self-pay | Admitting: Family Medicine

## 2014-05-28 NOTE — L&D Delivery Note (Signed)
Delivery Note  First Stage: Labor onset@ 1020 - desired TOLAC Analgesia /Anesthesia intrapartum: epidural AROM at 1222  Second Stage: Complete dilation at 1527 Onset of pushing at 1540 FHR second stage category 1  Delivery of a viable female at 1546 by Moss in LOA position no nuchal cord Cord double clamped after cessation of pulsation, cut by FOB Cord blood sample collected   Third [facility] @ 1550 Placenta disposition: hospital disposal Uterine tone firm / bleeding small  no laceration identified  Est. Blood Loss (mL): 200  Complications: none  Mom to postpartum.  Baby to Couplet care / Skin to Skin.  Newborn: Birth Weight: 7 pounds 4 ounces Apgar Scores: 7-9 Feeding planned: breast  Dawn MikeBAILEY, Dawn CNM, Dawn Moss, Dawn Moss 04/09/2015, 12:04 AM

## 2014-06-25 ENCOUNTER — Other Ambulatory Visit: Payer: Self-pay | Admitting: Family Medicine

## 2014-07-14 ENCOUNTER — Telehealth: Payer: Self-pay | Admitting: Family Medicine

## 2014-07-14 NOTE — Telephone Encounter (Signed)
Pt called to ask for a referral to have  a sleep study . Pt said she has always had trouble sleeping ..Marland Kitchen

## 2014-07-15 NOTE — Telephone Encounter (Signed)
Left a message for the pt to return my call.  

## 2014-07-15 NOTE — Telephone Encounter (Signed)
Can you help her schedule appt? Not urgent - but due for follow up and we can discuss.Thanks.

## 2014-07-16 NOTE — Telephone Encounter (Signed)
I called the pt and scheduled an appt for 3/3.

## 2014-07-29 ENCOUNTER — Encounter: Payer: Self-pay | Admitting: Family Medicine

## 2014-07-29 ENCOUNTER — Ambulatory Visit (INDEPENDENT_AMBULATORY_CARE_PROVIDER_SITE_OTHER): Payer: Commercial Managed Care - PPO | Admitting: Family Medicine

## 2014-07-29 VITALS — BP 100/82 | HR 89 | Temp 98.2°F | Ht 64.0 in | Wt 145.1 lb

## 2014-07-29 DIAGNOSIS — G4710 Hypersomnia, unspecified: Secondary | ICD-10-CM

## 2014-07-29 DIAGNOSIS — F32A Depression, unspecified: Secondary | ICD-10-CM

## 2014-07-29 DIAGNOSIS — E039 Hypothyroidism, unspecified: Secondary | ICD-10-CM

## 2014-07-29 DIAGNOSIS — F329 Major depressive disorder, single episode, unspecified: Secondary | ICD-10-CM

## 2014-07-29 DIAGNOSIS — R0683 Snoring: Secondary | ICD-10-CM | POA: Diagnosis not present

## 2014-07-29 DIAGNOSIS — R400 Somnolence: Secondary | ICD-10-CM

## 2014-07-29 LAB — CBC WITH DIFFERENTIAL/PLATELET
BASOS ABS: 0 10*3/uL (ref 0.0–0.1)
Basophils Relative: 0.4 % (ref 0.0–3.0)
Eosinophils Absolute: 0.1 10*3/uL (ref 0.0–0.7)
Eosinophils Relative: 2.4 % (ref 0.0–5.0)
HCT: 43.9 % (ref 36.0–46.0)
HEMOGLOBIN: 15 g/dL (ref 12.0–15.0)
LYMPHS PCT: 24.2 % (ref 12.0–46.0)
Lymphs Abs: 1.2 10*3/uL (ref 0.7–4.0)
MCHC: 34.2 g/dL (ref 30.0–36.0)
MCV: 92.6 fl (ref 78.0–100.0)
Monocytes Absolute: 0.5 10*3/uL (ref 0.1–1.0)
Monocytes Relative: 9.8 % (ref 3.0–12.0)
Neutro Abs: 3.1 10*3/uL (ref 1.4–7.7)
Neutrophils Relative %: 63.2 % (ref 43.0–77.0)
PLATELETS: 291 10*3/uL (ref 150.0–400.0)
RBC: 4.74 Mil/uL (ref 3.87–5.11)
RDW: 14 % (ref 11.5–15.5)
WBC: 4.9 10*3/uL (ref 4.0–10.5)

## 2014-07-29 LAB — LIPID PANEL
CHOL/HDL RATIO: 4
Cholesterol: 235 mg/dL — ABNORMAL HIGH (ref 0–200)
HDL: 57.3 mg/dL (ref >39.00)
LDL Cholesterol: 150 mg/dL — ABNORMAL HIGH (ref 0–99)
NONHDL: 177.7
Triglycerides: 141 mg/dL (ref 0.0–149.0)
VLDL: 28.2 mg/dL (ref 0.0–40.0)

## 2014-07-29 LAB — HEMOGLOBIN A1C: HEMOGLOBIN A1C: 4.9 % (ref 4.6–6.5)

## 2014-07-29 LAB — TSH: TSH: 2.3 u[IU]/mL (ref 0.35–4.50)

## 2014-07-29 NOTE — Progress Notes (Signed)
HPI:  Acute visit for:  ? OSA: -chronically does not sleep well, on sleep aides in the past and did not help -husband told her she snores  -snoring is worsening -sleep is a little better on celexa, but wakes up tired even if feels like sleeps 8 hours -usually gets only 6.5-7 hours of sleep per night -husband has not noted apneic spells  - but wakes herself up snoring  Follow up:  1)Depression:  -she started celexa 01/2014 -doing great on celexa  -reports: since delivery of 2nd child July 2015, symptoms resolved -hx of ADD dx as adult, dx by PA, saw psychologist, was on Adderall in the past - but hated taking a stimulant - then was on celexa and did great  -denies:SI, thoughts of self harm   2)Hypothyroid: -last TSH check 5 months ago, usually sees gyn for this -on very low dose synthroid for this from gyn   ROS: See pertinent positives and negatives per HPI.  Past Medical History  Diagnosis Date  . Depression   . Hypothyroidism   . Family history of malignant neoplasm of breast   . Infertility, female   . ZOXWRUEA(540.9)     Past Surgical History  Procedure Laterality Date  . Tonsillectomy      1997  . Knee surgery    . Cesarean section N/A 12/18/2013    Procedure: Primary CESAREAN SECTION;  Surgeon: Lenoard Aden, MD;  Location: [facility];  Service: Obstetrics;  Laterality: N/A;    Family History  Problem Relation Age of Onset  . Crohn's disease Mother   . Depression Mother   . Hypertension Father   . Cancer Maternal Grandmother     breast cancer  . Heart disease Maternal Grandfather   . Diabetes Paternal Grandfather   . Heart disease Paternal Grandfather     History   Social History  . Marital Status: Married    Spouse Name: N/A  . Number of Children: N/A  . Years of Education: N/A   Social History Main Topics  . Smoking status: Former Smoker    Quit date: 10/07/2010  . Smokeless tobacco: Never Used  . Alcohol Use: Yes     Comment: glass or  2 of wine per week  . Drug Use: No  . Sexual Activity: Yes   Other Topics Concern  . 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  citalopram (CELEXA) 20 MG tablet, TAKE 1 TABLET BY MOUTH EVERY DAY( PATIENT NEEDS TO CALL OFFICE FOR APPOINTMENT), Disp: 30 tablet, Rfl: 0 .  levothyroxine (SYNTHROID, LEVOTHROID) 25 MCG tablet, Take 25 mcg by mouth daily before breakfast., Disp: , Rfl:  .  loratadine (CLARITIN) 10 MG tablet, Take 10 mg by mouth daily., Disp: , Rfl:  .  Prenatal Vit-Fe Fumarate-FA (PRENATAL MULTIVITAMIN) TABS tablet, Take 1 tablet by mouth daily at 12 noon., Disp: , Rfl:  .  psyllium (METAMUCIL) 58.6 % packet, Take 1 packet by mouth daily., Disp: , Rfl:   EXAM:  Filed Vitals:   07/29/14 1019  BP: 100/82  Pulse:  89  Temp: 98.2 F (36.8 C)    Body mass index is 24.89 kg/(m^2).  GENERAL: vitals reviewed and listed above, alert, oriented, appears well hydrated and in no acute distress  HEENT: atraumatic, conjunttiva clear, no obvious abnormalities on inspection of external nose and ears  NECK: no obvious masses on inspection  LUNGS: clear to auscultation bilaterally, no wheezes, rales or rhonchi, good air movement  CV: HRRR, no peripheral edema  MS: moves all extremities without noticeable abnormality  PSYCH: pleasant and cooperative, no obvious depression or anxiety  ASSESSMENT AND PLAN:  Discussed the following assessment and plan:  Hypothyroidism, unspecified hypothyroidism type - Plan: TSH  Snoring - Plan: Nocturnal polysomnography (NPSG)  Daytime somnolence - Plan: CBC with Differential, Nocturnal polysomnography (NPSG), Hemoglobin A1c, Lipid  Panel  Depression  -labs today - NON- FASTING; but doing labs today -TSH and cbc for fatigue, sleep study -follow up in 3 months, consider taper off celexa if wishes at that point -Patient advised to return or notify a doctor immediately if symptoms worsen or persist or new concerns arise.  There are no Patient Instructions on file for this visit.   Kriste BasqueKIM, HANNAH R.

## 2014-07-29 NOTE — Progress Notes (Signed)
Pre visit review using our clinic review tool, if applicable. No additional management support is needed unless otherwise documented below in the visit note. 

## 2014-07-30 ENCOUNTER — Other Ambulatory Visit: Payer: Self-pay | Admitting: *Deleted

## 2014-07-30 MED ORDER — LEVOTHYROXINE SODIUM 25 MCG PO TABS: ORAL_TABLET | ORAL | Status: DC

## 2014-07-31 ENCOUNTER — Other Ambulatory Visit: Payer: Self-pay | Admitting: Family Medicine

## 2014-08-03 ENCOUNTER — Telehealth: Payer: Self-pay | Admitting: Family Medicine

## 2014-08-03 NOTE — Telephone Encounter (Signed)
Pt was seen on 07-28-13 and pt needs new rx levothyroxine 25 ncg. Pt was told to take 50 mcg every other day and 25 mg also. [facility]

## 2014-08-03 NOTE — Telephone Encounter (Signed)
I left a message at the pts cell number the Rx was sent in on 3/4.

## 2014-08-13 ENCOUNTER — Telehealth: Payer: Self-pay | Admitting: Family Medicine

## 2014-08-13 NOTE — Telephone Encounter (Signed)
Opened in error

## 2014-09-13 ENCOUNTER — Other Ambulatory Visit: Payer: Commercial Managed Care - PPO

## 2014-10-04 ENCOUNTER — Telehealth: Payer: Self-pay | Admitting: Family Medicine

## 2014-10-04 NOTE — Telephone Encounter (Signed)
Pt said she is not going to have the sleep study done she found out she is pregnant. Her ob doctor will be monitoring her thyroid and is asking if this is ok since the rx is done by Dr Selena BattenKim. He is doing labs every other month .

## 2014-10-04 NOTE — Telephone Encounter (Signed)
Please let her know I said congratulations!! Yes, during pregnancy I advise her obstetrician or an endocrinologist monitor and manage her thyroid medication.

## 2014-10-05 NOTE — Telephone Encounter (Signed)
I left a detailed message at the pts cell number with the message below.   

## 2014-10-08 ENCOUNTER — Encounter (HOSPITAL_BASED_OUTPATIENT_CLINIC_OR_DEPARTMENT_OTHER): Payer: Commercial Managed Care - PPO

## 2014-10-10 ENCOUNTER — Encounter (HOSPITAL_BASED_OUTPATIENT_CLINIC_OR_DEPARTMENT_OTHER): Payer: Commercial Managed Care - PPO

## 2014-11-18 ENCOUNTER — Other Ambulatory Visit: Payer: Self-pay | Admitting: Family Medicine

## 2014-11-24 ENCOUNTER — Other Ambulatory Visit: Payer: Self-pay | Admitting: Family Medicine

## 2014-12-31 ENCOUNTER — Inpatient Hospital Stay (HOSPITAL_COMMUNITY)
Admission: AD | Admit: 2014-12-31 | Payer: Commercial Managed Care - PPO | Source: Ambulatory Visit | Admitting: Obstetrics and Gynecology

## 2015-02-23 ENCOUNTER — Telehealth: Payer: Self-pay

## 2015-02-23 NOTE — Telephone Encounter (Signed)
Dr. Vickey Huger asked me to schedule this pt 10/3 at 12:00 for a sleep consult. Pt is aware, and is thankful for sooner appt.

## 2015-02-28 ENCOUNTER — Encounter: Payer: Self-pay | Admitting: Neurology

## 2015-02-28 ENCOUNTER — Ambulatory Visit (INDEPENDENT_AMBULATORY_CARE_PROVIDER_SITE_OTHER): Payer: Commercial Managed Care - PPO | Admitting: Neurology

## 2015-02-28 VITALS — BP 108/70 | HR 92 | Resp 20 | Ht 64.0 in | Wt 162.0 lb

## 2015-02-28 DIAGNOSIS — G2581 Restless legs syndrome: Secondary | ICD-10-CM | POA: Diagnosis not present

## 2015-02-28 DIAGNOSIS — R0683 Snoring: Secondary | ICD-10-CM | POA: Diagnosis not present

## 2015-02-28 DIAGNOSIS — G4701 Insomnia due to medical condition: Secondary | ICD-10-CM | POA: Diagnosis not present

## 2015-02-28 DIAGNOSIS — O26813 Pregnancy related exhaustion and fatigue, third trimester: Secondary | ICD-10-CM

## 2015-02-28 NOTE — Patient Instructions (Signed)
Insomnia Insomnia is frequent trouble falling and/or staying asleep. Insomnia can be a long term problem or a short term problem. Both are common. Insomnia can be a short term problem when the wakefulness is related to a certain stress or worry. Long term insomnia is often related to ongoing stress during waking hours and/or poor sleeping habits. Overtime, sleep deprivation itself can make the problem worse. Every little thing feels more severe because you are overtired and your ability to cope is decreased. CAUSES   Stress, anxiety, and depression.  Poor sleeping habits.  Distractions such as TV in the bedroom.  Naps close to bedtime.  Engaging in emotionally charged conversations before bed.  Technical reading before sleep.  Alcohol and other sedatives. They may make the problem worse. They can hurt normal sleep patterns and normal dream activity.  Stimulants such as caffeine for several hours prior to bedtime.  Pain syndromes and shortness of breath can cause insomnia.  Exercise late at night.  Changing time zones may cause sleeping problems (jet lag). It is sometimes helpful to have someone observe your sleeping patterns. They should look for periods of not breathing during the night (sleep apnea). They should also look to see how long those periods last. If you live alone or observers are uncertain, you can also be observed at a sleep clinic where your sleep patterns will be professionally monitored. Sleep apnea requires a checkup and treatment. Give your caregivers your medical history. Give your caregivers observations your family has made about your sleep.  SYMPTOMS   Not feeling rested in the morning.  Anxiety and restlessness at bedtime.  Difficulty falling and staying asleep. TREATMENT   Your caregiver may prescribe treatment for an underlying medical disorders. Your caregiver can give advice or help if you are using alcohol or other drugs for self-medication. Treatment  of underlying problems will usually eliminate insomnia problems.  Medications can be prescribed for short time use. They are generally not recommended for lengthy use.  Over-the-counter sleep medicines are not recommended for lengthy use. They can be habit forming.  You can promote easier sleeping by making lifestyle changes such as:  Using relaxation techniques that help with breathing and reduce muscle tension.  Exercising earlier in the day.  Changing your diet and the time of your last meal. No night time snacks.  Establish a regular time to go to bed.  Counseling can help with stressful problems and worry.  Soothing music and white noise may be helpful if there are background noises you cannot remove.  Stop tedious detailed work at least one hour before bedtime. HOME CARE INSTRUCTIONS   Keep a diary. Inform your caregiver about your progress. This includes any medication side effects. See your caregiver regularly. Take note of:  Times when you are asleep.  Times when you are awake during the night.  The quality of your sleep.  How you feel the next day. This information will help your caregiver care for you.  Get out of bed if you are still awake after 15 minutes. Read or do some quiet activity. Keep the lights down. Wait until you feel sleepy and go back to bed.  Keep regular sleeping and waking hours. Avoid naps.  Exercise regularly.  Avoid distractions at bedtime. Distractions include watching television or engaging in any intense or detailed activity like attempting to balance the household checkbook.  Develop a bedtime ritual. Keep a familiar routine of bathing, brushing your teeth, climbing into bed at the same   time each night, listening to soothing music. Routines increase the success of falling to sleep faster.  Use relaxation techniques. This can be using breathing and muscle tension release routines. It can also include visualizing peaceful scenes. You can  also help control troubling or intruding thoughts by keeping your mind occupied with boring or repetitive thoughts like the old concept of counting sheep. You can make it more creative like imagining planting one beautiful flower after another in your backyard garden.  During your day, work to eliminate stress. When this is not possible use some of the previous suggestions to help reduce the anxiety that accompanies stressful situations. MAKE SURE YOU:   Understand these instructions.  Will watch your condition.  Will get help right away if you are not doing well or get worse. Document Released: 05/11/2000 Document Revised: 08/06/2011 Document Reviewed: 06/11/2007 [facility] Patient Information 2015 [place], [place]. This information is not intended to replace advice given to you by your health care provider. Make sure you discuss any questions you have with your health care provider. Melatonin oral capsules and tablets What is this medicine? MELATONIN (mel uh TOH nin) is a dietary supplement. It is promoted to help maintain normal sleep patterns. The FDA has not approved this supplement for any medical use. This supplement may be used for other purposes; ask your health care provider or pharmacist if you have questions. This medicine may be used for other purposes; ask your health care provider or pharmacist if you have questions. COMMON BRAND NAME(S): Melatonex What should I tell my health care provider before I take this medicine? They need to know if you have any of these conditions: -cancer -if you frequently drink alcohol containing drinks -immune system problems -liver disease -seizure disorder -an unusual or allergic reaction to melatonin, other medicines, foods, dyes, or preservatives -pregnant or trying to get pregnant -breast-feeding How should I use this medicine? Take this supplement by mouth with a glass of water. This supplement is usually taken prior to bedtime. Do not chew  or crush most tablets or capsules. Some tablets are chewable and are chewed before swallowing. Some tablets are meant to be dissolved in the mouth or under the tongue. Follow the directions on the package labeling, or take as directed by your health care professional. Do not take this supplement more often than directed. Talk to your pediatrician regarding the use of this supplement in children. This supplement is not recommended for use in children. Overdosage: If you think you have taken too much of this medicine contact a poison control center or emergency room at once. NOTE: This medicine is only for you. Do not share this medicine with others. What if I miss a dose? This does not apply; this medicine is not for regular use. Do not take double or extra doses. What may interact with this medicine? Check with your doctor or healthcare professional if you are taking any of the following medications: -hormone medicines -medicines for blood pressure like nifedipine -medications for anxiety, depression, or other emotional or psychiatric problems -medications for seizures -medications for sleep -other herbal or dietary supplements -tamoxifen -treatments for cancer or immune disorders This list may not describe all possible interactions. Give your health care provider a list of all the medicines, herbs, non-prescription drugs, or dietary supplements you use. Also tell them if you smoke, drink alcohol, or use illegal drugs. Some items may interact with your medicine. What should I watch for while using this medicine? See your doctor  if your symptoms do not get better or if they get worse. Do not take this supplement for more than 2 weeks unless your doctor tells you to. You may get drowsy or dizzy. Do not drive, use machinery, or do anything that needs mental alertness until you know how this medicine affects you. Do not stand or sit up quickly, especially if you are an older patient. This reduces the  risk of dizzy or fainting spells. Alcohol may interfere with the effect of this medicine. Avoid alcoholic drinks. Talk to your doctor before you use this supplement if you are currently being treated for an emotional, mental, or sleep problem. This medicine may interfere with your treatment. Herbal or dietary supplements are not regulated like medicines. Rigid quality control standards are not required for dietary supplements. The purity and strength of these products can vary. The safety and effect of this dietary supplement for a certain disease or illness is not well known. This product is not intended to diagnose, treat, cure or prevent any disease. The Food and Drug Administration suggests the following to help consumers protect themselves: -Always read product labels and follow directions. -Natural does not mean a product is safe for humans to take. -Look for products that include USP after the ingredient name. This means that the manufacturer followed the standards of the [place] Pharmacopoeia. -Supplements made or sold by a nationally known food or drug company are more likely to be made under tight controls. You can write to the company for more information about how the product was made. What side effects may I notice from receiving this medicine? Side effects that you should report to your doctor or health care professional as soon as possible: -allergic reactions like skin rash, itching or hives, swelling of the face, lips, or tongue -breathing problems -confusion, forgetful -depressed, nervous, or other mood changes -fast or pounding heartbeat -trouble staying awake or alert during the day Side effects that usually do not require medical attention (report to your doctor or health care professional if they continue or are bothersome): -drowsiness, dizziness -headache -nightmares -upset stomach This list may not describe all possible side effects. Call your doctor for medical advice about  side effects. You may report side effects to FDA at 1-800-FDA-1088. Where should I keep my medicine? Keep out of the reach of children. Store at room temperature or as directed on the package label. Protect from moisture. Throw away any unused supplement after the expiration date. NOTE: This sheet is a summary. It may not cover all possible information. If you have questions about this medicine, talk to your doctor, pharmacist, or health care provider.  2015, Elsevier/Gold Standard. (2013-03-27 18:36:27)

## 2015-02-28 NOTE — Progress Notes (Signed)
SLEEP MEDICINE CLINIC   Provider:  Melvyn Novas, M D  Referring Provider: Rosemary Holms, MD  Primary Care Physician:  Terressa Koyanagi., DO  Chief Complaint  Patient presents with  . New Patient (Initial Visit)    sleep consult, snoring, RLS while pregnant, pt is currently pregnant, rm 11, alone    Chief complaint according to patient : I am pregnant and feel extremely fatigued" , This is the patient's third pregancy.    HPI:  Dawn Moss is a 39 y.o. female , seen here as a referral  from Dr. Rosemary Holms for a sleep evaluation,  Mrs. Callan presents during her third pregnancy, due date is in mid November 2016. She reports that in 2013 been pregnant with her first child she was also scheduled for a sleep study but counseled course she was pregnant. She attributed to fatigue and non-restorative sleep to the stage of pregnancy. Now pregnant for the third time she has had more restless leg syndrome, and she reports that she has been a snorer ever since pregnant for the first time. She is not sure if she may have sleep apnea. Her husband however feels that he has witnessed enough evidence of sleep apnea being present. In addition the patient presents with a significant amount of fatigue. The fatigue severity score was endorsed at 53 points which is highly elevated, and her sleep Epworth sleepiness score was only scored at 5 points. She does not have problems to fall asleep uncontrollably, she has problems getting to sleep when she wants to or needs to. Up to 8 years ago she took adderall in daytime, and the insomnia was attributed to this. She took Adderall for ADD, and for fatigue.  She never had sleep attacks.  Sleep habits are as follows:  The patient states that she usually feels bed ready between 9 and 10 PM. If she watches TV she usually feels that her attention is declining and she is ready for bed. When she transfers to bed however she lays there with her eyes closed and feels that she cannot enter  sleep. She feels as if she is just at the cusp. Her husband states that if she falls asleep she quickly will wake up again and he has noted her to snore and changing breathing patterns as she falls asleep. She prefers not to sleep on the back. She prefers to sleep on her belly or on her side. The bedroom is described as core, quiet and dark. Even if she could sleep 10 hours she would not wake up feeling refreshed and restored, she reports. She spontaneously wakes up at 6 or 7 AM, but relies on an alarm. Now in the third trimester of pregnancy she has about 4-5 bathroom breaks at night. She always has a hot shower before going to bed, wich should foster restorative sleep. She exercises 4 times a week with a trainer and not shortly before bedtime. She eats a well-balanced diet.  Sleep medical history and family sleep history: The patient has a lifelong history of insomnia or poor sleep maintenance. Her mother report that she did well as an infant, but during her school years the sleep pattern changed. She did never have night terrors, sleep walking, sleep talking.  Her brother is an omnisomniac , who can sleep anytime anyplace.    Social history: Married with 2 children third child on the way. 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 Masters degree. Works with a non Agricultural engineer.  Review of  Systems: Out of a complete 14 system review, the patient complains of only the following symptoms, and all other reviewed systems are negative.  Epworth score 5, Fatigue severity score 53,   Snoring.   Social History   Social History  . Marital Status: Married    Spouse Name: N/A  . Number of Children: N/A  . Years of Education: N/A   Occupational History  . Not on file.   Social History Main Topics  . Smoking status: Former Smoker    Quit date: 10/07/2010  . Smokeless tobacco: Never Used  . Alcohol Use: Yes     Comment: glass or 2 of wine per week  . Drug Use: No  . Sexual Activity: Yes     Other Topics Concern  . 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 Crohn's disease Mother   . Depression Mother   . Hypertension Father   . Cancer Maternal Grandmother     breast cancer  . Heart disease Maternal Grandfather   . Diabetes Paternal Grandfather   . Heart disease Paternal Grandfather     Past Medical History  Diagnosis Date  . Depression   . Hypothyroidism   . Family history of malignant neoplasm of breast   . Infertility, female   . ZOXWRUEA(540.9)     Past Surgical History  Procedure Laterality Date  . Tonsillectomy      1997  . Knee surgery    . Cesarean section N/A 12/18/2013    Procedure: Primary CESAREAN SECTION;  Surgeon: Lenoard Aden, MD;  Location: [facility];  Service: Obstetrics;  Laterality: N/A;    Current Outpatient Prescriptions  Medication Sig Dispense Refill  . citalopram (CELEXA) 20 MG tablet Take 1 tablet by mouth every day 30 tablet 3  . levothyroxine (SYNTHROID, LEVOTHROID) 25 MCG tablet Take 1 daily by mouth qod 2 tabs by mouth qod 45 tablet 1  . loratadine (CLARITIN) 10 MG tablet Take 10 mg by mouth daily.    . Prenatal Vit-Fe Fumarate-FA (PRENATAL MULTIVITAMIN) TABS tablet Take 1 tablet by mouth daily at 12 noon.    . psyllium (METAMUCIL) 58.6 % packet Take 1 packet by mouth daily.     No current facility-administered medications for this visit.    Allergies as of 02/28/2015 - Review Complete 02/28/2015  Allergen Reaction Noted  . Codeine  02/28/2015  . Morphine and related  02/28/2015  . Shellfish allergy Nausea And Vomiting 11/06/2012    Vitals: BP 108/70 mmHg  Pulse 92  Resp 20  Ht 5\' 4"   (1.626 m)  Wt 162 lb (73.483 kg)  BMI 27.79 kg/m2 Last Weight:  Wt Readings from Last 1 Encounters:  02/28/15 162 lb (73.483 kg)   WJX:BJYN mass index is 27.79 kg/(m^2).     Last Height:   Ht Readings from Last 1 Encounters:  02/28/15 5\' 4"  (1.626 m)    Physical exam:  General: The patient is awake, alert and appears not in acute distress. The patient is well groomed. Head: Normocephalic, atraumatic.  Neck is supple. Mallampati 2 neck circumference: 1.5 airflow  Through the nose is restricted  TMJ ; negative  Retrognathia is seen.  Cardiovascular:  Regular rate and rhythm, without  murmurs or carotid bruit, and without distended neck veins. Respiratory: Lungs are clear to auscultation. Skin:  Without evidence of edema, or rash Trunk:  The patient's posture is erect.  Neurologic exam : The patient is awake and alert, oriented to place and time.   Memory subjective  described as intact.     Attention span & concentration ability appears normal.  Speech is fluent,  without  dysarthria, dysphonia or aphasia.  Mood and affect are appropriate.  Cranial nerves: Pupils are equal and briskly reactive to light. Funduscopic exam without  evidence of pallor or edema.  Extraocular movements  in vertical and horizontal planes intact and without nystagmus. Visual fields by finger perimetry are intact. Hearing to finger rub intact.   Facial sensation intact to fine touch.  Facial motor strength is symmetric and tongue and uvula move midline. Shoulder shrug was symmetrical.   Motor exam:    Normal tone, muscle bulk and symmetric strength in all extremities.  Sensory:  Fine touch, pinprick and vibration were tested in all extremities. Proprioception tested in the upper extremities was normal.  Coordination: Rapid alternating movements in the fingers/hands was normal. Finger-to-nose maneuver  normal without evidence of ataxia, dysmetria or tremor.  Gait and station: Patient walks without  assistive device and is able unassisted to climb up to the exam table. Strength within normal limits.  Stance is stable and normal.  Toe and hell stand were tested .Tandem gait is unfragmented. Turns with  3 Steps. Romberg testing is  negative.  Deep tendon reflexes: in the  upper and lower extremities are symmetric and intact. Babinski maneuver response is downgoing.  The patient was advised of the nature of the diagnosed sleep disorder , the treatment options and risks for general a health and wellness arising from not treating the condition.  I spent more than 40 minutes of face to face time with the patient. Greater than 50% of time was spent in counseling and coordination of care. We have discussed the diagnosis and differential and I answered the patient's questions.     Assessment:  After physical and neurologic examination, review of laboratory studies,  Personal review of imaging studies, reports of other /same  Imaging studies ,  Results of polysomnography/ neurophysiology testing and pre-existing records as far as provided in visit., my assessment is   1) the patient has a almost lifelong history of insomnia which may have been precipitated by the intake of Adderall during school years. However she has now a problem to initiate sleep and maintain sleep and her sleep quality is non-restorative. Her husband has noted her to snore and has witnessed apneas. For this reason I think that sleep apnea may be well the contributing factor to her current insomnia and daytime excessive fatigue. Interestingly she does not report excessive daytime sleepiness. I will order a home sleep test for her given that she is in her third trimester and that is certainly more comfortable to be screened for obstructive sleep apnea at home. Also encouraged her to keep her sleep hygiene up. Most of which she had already established. Eliminate screen light and back late screens from the bedroom, close a door, has a  background noises of white noise that blinds out other noises. Use light blocking curtains. Keep the temperature and the bedroom  below 99F, and please have a hot bath or shower before bed as the decrease in core body temperature helps to maintain sleep. It is considered safe in pregnancy to use melatonin which not only helps to maintain sleep but may help to initiate sleep in the first place.   Plan:  Treatment plan and additional workup : HST, Rv in 3-5 weeks.    Porfirio Mylar Kamiya Acord MD  02/28/2015   CC: Terressa Koyanagi, [address] [place], [place] [address]

## 2015-03-01 ENCOUNTER — Telehealth: Payer: Self-pay | Admitting: Neurology

## 2015-03-01 LAB — FERRITIN: Ferritin: 14 ng/mL — ABNORMAL LOW (ref 15–150)

## 2015-03-01 NOTE — Telephone Encounter (Signed)
Pt called inquiring if RX for melatonin was to be called in. She states she purchased OTC. Per Kristen: Melatonin is OTC, no RX.Marland Kitchen Follow directions on the bottle and it is safe for pregnancy. If pt has any questions to call office.

## 2015-03-02 ENCOUNTER — Telehealth: Payer: Self-pay

## 2015-03-02 NOTE — Telephone Encounter (Signed)
-----   Message from Melvyn Novas, MD sent at 03/02/2015 11:43 AM EDT ----- Dawn Moss is a pregnant patient, third trimester. She is  iron deficient and has a very low Ferritin level. This can explain her RLS. I like for her to take an iron supplement three times a day. The iron levels sometimes don't come up after birth and she should continue while nursing.  I am looking forward to her sleep study. CD

## 2015-03-02 NOTE — Telephone Encounter (Signed)
Spoke to pt and advised her that she is iron deficient and has a very low ferritin level which could explain her RLS. Pt states she is taking vitafol daily and this does have iron. She wants me to fax these results to Dr. Billy Coast, her OBGYN, to see and she wants to talk to him about safe iron supplementation in pregnancy. I advised her that I would fax these results to him and advised her to call us back with further questions or concerns. Pt verbalized understanding.

## 2015-03-08 ENCOUNTER — Institutional Professional Consult (permissible substitution): Payer: Commercial Managed Care - PPO | Admitting: Neurology

## 2015-03-08 ENCOUNTER — Encounter (INDEPENDENT_AMBULATORY_CARE_PROVIDER_SITE_OTHER): Payer: Commercial Managed Care - PPO | Admitting: Neurology

## 2015-03-08 DIAGNOSIS — G4710 Hypersomnia, unspecified: Secondary | ICD-10-CM | POA: Diagnosis not present

## 2015-03-08 DIAGNOSIS — G4701 Insomnia due to medical condition: Secondary | ICD-10-CM

## 2015-03-08 DIAGNOSIS — O26813 Pregnancy related exhaustion and fatigue, third trimester: Secondary | ICD-10-CM

## 2015-03-08 DIAGNOSIS — R0683 Snoring: Secondary | ICD-10-CM

## 2015-03-10 ENCOUNTER — Institutional Professional Consult (permissible substitution): Payer: Commercial Managed Care - PPO | Admitting: Neurology

## 2015-03-14 ENCOUNTER — Telehealth: Payer: Self-pay | Admitting: Neurology

## 2015-03-14 NOTE — Telephone Encounter (Signed)
Called pt with sleep study results. No answer, left a message asking her to call me back. 

## 2015-03-14 NOTE — Telephone Encounter (Signed)
Patient called requesting Sleep Study results. Please call [phone].

## 2015-03-14 NOTE — Telephone Encounter (Signed)
Spoke with pt regarding her sleep study results. Advised pt that osa was not seen in her study but there was snoring. I advised pt that snoring therapies are available such as a dental device or an ENT evaluation. Pt says that she actually slept really well the night of this test and is concerned that this might not reflect what usually happens to her at night. Pt says she wants to make an appt after her baby is born to discuss the sleep study results and treatment options. She will call us in about one month to make this appt.

## 2015-04-08 ENCOUNTER — Inpatient Hospital Stay (HOSPITAL_COMMUNITY)
Admission: AD | Admit: 2015-04-08 | Discharge: 2015-04-08 | Disposition: A | Discharge status: Home / Self Care | Payer: Commercial Managed Care - PPO | Source: Ambulatory Visit | Attending: Obstetrics and Gynecology | Admitting: Obstetrics and Gynecology

## 2015-04-08 ENCOUNTER — Inpatient Hospital Stay (HOSPITAL_COMMUNITY): Payer: Commercial Managed Care - PPO | Admitting: Anesthesiology

## 2015-04-08 ENCOUNTER — Inpatient Hospital Stay (HOSPITAL_COMMUNITY)
Admission: AD | Admit: 2015-04-08 | Discharge: 2015-04-09 | DRG: 775 | Disposition: A | Discharge status: Home / Self Care | Payer: Commercial Managed Care - PPO | Source: Ambulatory Visit | Attending: Obstetrics & Gynecology | Admitting: Obstetrics & Gynecology

## 2015-04-08 ENCOUNTER — Encounter (HOSPITAL_COMMUNITY): Payer: Self-pay | Admitting: *Deleted

## 2015-04-08 ENCOUNTER — Encounter (HOSPITAL_COMMUNITY): Payer: Self-pay

## 2015-04-08 DIAGNOSIS — O34219 Maternal care for unspecified type scar from previous cesarean delivery: Secondary | ICD-10-CM | POA: Diagnosis present

## 2015-04-08 DIAGNOSIS — E039 Hypothyroidism, unspecified: Secondary | ICD-10-CM | POA: Diagnosis present

## 2015-04-08 DIAGNOSIS — O99284 Endocrine, nutritional and metabolic diseases complicating childbirth: Secondary | ICD-10-CM | POA: Diagnosis present

## 2015-04-08 DIAGNOSIS — Z3A38 38 weeks gestation of pregnancy: Secondary | ICD-10-CM | POA: Diagnosis not present

## 2015-04-08 DIAGNOSIS — Z3490 Encounter for supervision of normal pregnancy, unspecified, unspecified trimester: Secondary | ICD-10-CM

## 2015-04-08 DIAGNOSIS — Z87891 Personal history of nicotine dependence: Secondary | ICD-10-CM | POA: Diagnosis not present

## 2015-04-08 LAB — TYPE AND SCREEN
ABO/RH(D): O POS
ANTIBODY SCREEN: NEGATIVE

## 2015-04-08 LAB — CBC
HEMATOCRIT: 39.1 % (ref 36.0–46.0)
HEMOGLOBIN: 13.8 g/dL (ref 12.0–15.0)
MCH: 32.8 pg (ref 26.0–34.0)
MCHC: 35.3 g/dL (ref 30.0–36.0)
MCV: 92.9 fL (ref 78.0–100.0)
Platelets: 162 10*3/uL (ref 150–400)
RBC: 4.21 MIL/uL (ref 3.87–5.11)
RDW: 15.1 % (ref 11.5–15.5)
WBC: 13.5 10*3/uL — ABNORMAL HIGH (ref 4.0–10.5)

## 2015-04-08 MED ORDER — WITCH HAZEL-GLYCERIN EX PADS: 1.0000 "application " | MEDICATED_PAD | CUTANEOUS | Status: DC | PRN

## 2015-04-08 MED ORDER — LEVOTHYROXINE SODIUM 25 MCG PO TABS
25.0000 ug | ORAL_TABLET | Freq: Every day | ORAL | Status: DC
  Administered 2015-04-09: 25 ug via ORAL
  Filled 2015-04-08 (×2): qty 1

## 2015-04-08 MED ORDER — ZOLPIDEM TARTRATE 5 MG PO TABS
5.0000 mg | ORAL_TABLET | Freq: Once | ORAL | Status: AC
  Administered 2015-04-08: 5 mg via ORAL
  Filled 2015-04-08: qty 1

## 2015-04-08 MED ORDER — LANOLIN HYDROUS EX OINT: TOPICAL_OINTMENT | CUTANEOUS | Status: DC | PRN

## 2015-04-08 MED ORDER — EPHEDRINE 5 MG/ML INJ
10.0000 mg | INTRAVENOUS | Status: DC | PRN
Start: 2015-04-08 — End: 2015-04-08

## 2015-04-08 MED ORDER — ONDANSETRON HCL 4 MG/2ML IJ SOLN
4.0000 mg | Freq: Four times a day (QID) | INTRAMUSCULAR | Status: DC | PRN
  Administered 2015-04-08: 4 mg via INTRAVENOUS
  Filled 2015-04-08: qty 2

## 2015-04-08 MED ORDER — PHENYLEPHRINE 40 MCG/ML (10ML) SYRINGE FOR IV PUSH (FOR BLOOD PRESSURE SUPPORT)
80.0000 ug | PREFILLED_SYRINGE | INTRAVENOUS | Status: DC | PRN
  Filled 2015-04-08: qty 20

## 2015-04-08 MED ORDER — FENTANYL 2.5 MCG/ML BUPIVACAINE 1/10 % EPIDURAL INFUSION (WH - ANES)
14.0000 mL/h | INTRAMUSCULAR | Status: DC | PRN
  Administered 2015-04-08: 14 mL/h via EPIDURAL
  Filled 2015-04-08: qty 125

## 2015-04-08 MED ORDER — LIDOCAINE HCL (PF) 1 % IJ SOLN
30.0000 mL | INTRAMUSCULAR | Status: DC | PRN
  Filled 2015-04-08: qty 30

## 2015-04-08 MED ORDER — LACTATED RINGERS IV SOLN
INTRAVENOUS | Status: DC
  Administered 2015-04-08: 13:00:00 via INTRAVENOUS
  Administered 2015-04-08: 999 mL/h via INTRAVENOUS

## 2015-04-08 MED ORDER — LIDOCAINE HCL (PF) 1 % IJ SOLN
INTRAMUSCULAR | Status: DC | PRN
  Administered 2015-04-08 (×2): 4 mL

## 2015-04-08 MED ORDER — ACETAMINOPHEN 325 MG PO TABS
650.0000 mg | ORAL_TABLET | ORAL | Status: DC | PRN
  Administered 2015-04-08: 650 mg via ORAL
  Filled 2015-04-08: qty 2

## 2015-04-08 MED ORDER — ACETAMINOPHEN 325 MG PO TABS: 650.0000 mg | ORAL_TABLET | ORAL | Status: DC | PRN

## 2015-04-08 MED ORDER — CITALOPRAM HYDROBROMIDE 20 MG PO TABS
20.0000 mg | ORAL_TABLET | Freq: Every day | ORAL | Status: DC
  Administered 2015-04-09: 20 mg via ORAL
  Filled 2015-04-08 (×3): qty 1

## 2015-04-08 MED ORDER — DIBUCAINE 1 % RE OINT: 1.0000 "application " | TOPICAL_OINTMENT | RECTAL | Status: DC | PRN

## 2015-04-08 MED ORDER — BENZOCAINE-MENTHOL 20-0.5 % EX AERO: 1.0000 "application " | INHALATION_SPRAY | CUTANEOUS | Status: DC | PRN

## 2015-04-08 MED ORDER — DIPHENHYDRAMINE HCL 50 MG/ML IJ SOLN: 12.5000 mg | INTRAMUSCULAR | Status: DC | PRN

## 2015-04-08 MED ORDER — OXYTOCIN BOLUS FROM INFUSION: 500.0000 mL | INTRAVENOUS | Status: DC

## 2015-04-08 MED ORDER — SENNOSIDES-DOCUSATE SODIUM 8.6-50 MG PO TABS
2.0000 | ORAL_TABLET | ORAL | Status: DC
  Administered 2015-04-09: 2 via ORAL
  Filled 2015-04-08: qty 2

## 2015-04-08 MED ORDER — OXYTOCIN 40 UNITS IN LACTATED RINGERS INFUSION - SIMPLE MED
62.5000 mL/h | INTRAVENOUS | Status: DC
  Administered 2015-04-08: 62.5 mL/h via INTRAVENOUS
  Filled 2015-04-08: qty 1000

## 2015-04-08 MED ORDER — OXYCODONE-ACETAMINOPHEN 5-325 MG PO TABS
1.0000 | ORAL_TABLET | ORAL | Status: DC | PRN
  Administered 2015-04-08 – 2015-04-09 (×5): 1 via ORAL
  Filled 2015-04-08 (×5): qty 1

## 2015-04-08 MED ORDER — CITRIC ACID-SODIUM CITRATE 334-500 MG/5ML PO SOLN: 30.0000 mL | ORAL | Status: DC | PRN

## 2015-04-08 MED ORDER — LACTATED RINGERS IV SOLN: 500.0000 mL | INTRAVENOUS | Status: DC | PRN

## 2015-04-08 MED ORDER — SIMETHICONE 80 MG PO CHEW: 80.0000 mg | CHEWABLE_TABLET | ORAL | Status: DC | PRN

## 2015-04-08 MED ORDER — IBUPROFEN 600 MG PO TABS
600.0000 mg | ORAL_TABLET | Freq: Four times a day (QID) | ORAL | Status: DC
  Administered 2015-04-08 – 2015-04-09 (×4): 600 mg via ORAL
  Filled 2015-04-08 (×4): qty 1

## 2015-04-08 NOTE — Discharge Instructions (Signed)
Braxton Hicks Contractions °Contractions of the uterus can occur throughout pregnancy. Contractions are not always a sign that you are in labor.  °WHAT ARE BRAXTON HICKS CONTRACTIONS?  °Contractions that occur before labor are called Braxton Hicks contractions, or false labor. Toward the end of pregnancy (32-34 weeks), these contractions can develop more often and may become more forceful. This is not true labor because these contractions do not result in opening (dilatation) and thinning of the cervix. They are sometimes difficult to tell apart from true labor because these contractions can be forceful and people have different pain tolerances. You should not feel embarrassed if you go to the hospital with false labor. Sometimes, the only way to tell if you are in true labor is for your health care provider to look for changes in the cervix. °If there are no prenatal problems or other health problems associated with the pregnancy, it is completely safe to be sent home with false labor and await the onset of true labor. °HOW CAN YOU TELL THE DIFFERENCE BETWEEN TRUE AND FALSE LABOR? °False Labor °· The contractions of false labor are usually shorter and not as hard as those of true labor.   °· The contractions are usually irregular.   °· The contractions are often felt in the front of the lower abdomen and in the groin.   °· The contractions may go away when you walk around or change positions while lying down.   °· The contractions get weaker and are shorter lasting as time goes on.   °· The contractions do not usually become progressively stronger, regular, and closer together as with true labor.   °True Labor °· Contractions in true labor last 30-70 seconds, become very regular, usually become more intense, and increase in frequency.   °· The contractions do not go away with walking.   °· The discomfort is usually felt in the top of the uterus and spreads to the lower abdomen and low back.   °· True labor can be  determined by your health care provider with an exam. This will show that the cervix is dilating and getting thinner.   °WHAT TO REMEMBER °· Keep up with your usual exercises and follow other instructions given by your health care provider.   °· Take medicines as directed by your health care provider.   °· Keep your regular prenatal appointments.   °· Eat and drink lightly if you think you are going into labor.   °· If Braxton Hicks contractions are making you uncomfortable:   °¨ Change your position from lying down or resting to walking, or from walking to resting.   °¨ Sit and rest in a tub of warm water.   °¨ Drink 2-3 glasses of water. Dehydration may cause these contractions.   °¨ Do slow and deep breathing several times an hour.   °WHEN SHOULD I SEEK IMMEDIATE MEDICAL CARE? °Seek immediate medical care if: °· Your contractions become stronger, more regular, and closer together.   °· You have fluid leaking or gushing from your vagina.   °· You have a fever.   °· You pass blood-tinged mucus.   °· You have vaginal bleeding.   °· You have continuous abdominal pain.   °· You have low back pain that you never had before.   °· You feel your baby's head pushing down and causing pelvic pressure.   °· Your baby is not moving as much as it used to.   °  °This information is not intended to replace advice given to you by your health care provider. Make sure you discuss any questions you have with your health care   provider. °  °Document Released: 05/14/2005 Document Revised: 05/19/2013 Document Reviewed: 02/23/2013 °Elsevier Interactive Patient Education ©2016 [employer] ° ° ° °Third Trimester of Pregnancy °The third trimester is from week 29 through week 42, months 7 through 9. The third trimester is a time when the fetus is growing rapidly. At the end of the ninth month, the fetus is about 20 inches in length and weighs 6-10 pounds.  °BODY CHANGES °Your body goes through many changes during pregnancy. The changes vary  from woman to woman.  °· Your weight will continue to increase. You can expect to gain 25-35 pounds (11-16 kg) by the end of the pregnancy. °· You may begin to get stretch marks on your hips, abdomen, and breasts. °· You may urinate more often because the fetus is moving lower into your pelvis and pressing on your bladder. °· You may develop or continue to have heartburn as a result of your pregnancy. °· You may develop constipation because certain hormones are causing the muscles that push waste through your intestines to slow down. °· You may develop hemorrhoids or swollen, bulging veins (varicose veins). °· You may have pelvic pain because of the weight gain and pregnancy hormones relaxing your joints between the bones in your pelvis. Backaches may result from overexertion of the muscles supporting your posture. °· You may have changes in your hair. These can include thickening of your hair, rapid growth, and changes in texture. Some women also have hair loss during or after pregnancy, or hair that feels dry or thin. Your hair will most likely return to normal after your baby is born. °· Your breasts will continue to grow and be tender. A yellow discharge may leak from your breasts called colostrum. °· Your belly button may stick out. °· You may feel short of breath because of your expanding uterus. °· You may notice the fetus "dropping," or moving lower in your abdomen. °· You may have a bloody mucus discharge. This usually occurs a few days to a week before labor begins. °· Your cervix becomes thin and soft (effaced) near your due date. °WHAT TO EXPECT AT YOUR PRENATAL EXAMS  °You will have prenatal exams every 2 weeks until week 36. Then, you will have weekly prenatal exams. During a routine prenatal visit: °· You will be weighed to make sure you and the fetus are growing normally. °· Your blood pressure is taken. °· Your abdomen will be measured to track your baby's growth. °· The fetal heartbeat will be  listened to. °· Any test results from the previous visit will be discussed. °· You may have a cervical check near your due date to see if you have effaced. °At around 36 weeks, your caregiver will check your cervix. At the same time, your caregiver will also perform a test on the secretions of the vaginal tissue. This test is to determine if a type of bacteria, Group B streptococcus, is present. Your caregiver will explain this further. °Your caregiver may ask you: °· What your birth plan is. °· How you are feeling. °· If you are feeling the baby move. °· If you have had any abnormal symptoms, such as leaking fluid, bleeding, severe headaches, or abdominal cramping. °· If you are using any tobacco products, including cigarettes, chewing tobacco, and electronic cigarettes. °· If you have any questions. °Other tests or screenings that may be performed during your third trimester include: °· Blood tests that check for low iron levels (anemia). °· Fetal   testing to check the health, activity level, and growth of the fetus. Testing is done if you have certain medical conditions or if there are problems during the pregnancy. °· HIV (human immunodeficiency virus) testing. If you are at high risk, you may be screened for HIV during your third trimester of pregnancy. °FALSE LABOR °You may feel small, irregular contractions that eventually go away. These are called Braxton Hicks contractions, or false labor. Contractions may last for hours, days, or even weeks before true labor sets in. If contractions come at regular intervals, intensify, or become painful, it is best to be seen by your caregiver.  °SIGNS OF LABOR  °· Menstrual-like cramps. °· Contractions that are 5 minutes apart or less. °· Contractions that start on the top of the uterus and spread down to the lower abdomen and back. °· A sense of increased pelvic pressure or back pain. °· A watery or bloody mucus discharge that comes from the vagina. °If you have any of  these signs before the 37th week of pregnancy, call your caregiver right away. You need to go to the hospital to get checked immediately. °HOME CARE INSTRUCTIONS  °· Avoid all smoking, herbs, alcohol, and unprescribed drugs. These chemicals affect the formation and growth of the baby. °· Do not use any tobacco products, including cigarettes, chewing tobacco, and electronic cigarettes. If you need help quitting, ask your health care provider. You may receive counseling support and other resources to help you quit. °· Follow your caregiver's instructions regarding medicine use. There are medicines that are either safe or unsafe to take during pregnancy. °· Exercise only as directed by your caregiver. Experiencing uterine cramps is a good sign to stop exercising. °· Continue to eat regular, healthy meals. °· Wear a good support bra for breast tenderness. °· Do not use hot tubs, steam rooms, or saunas. °· Wear your seat belt at all times when driving. °· Avoid raw meat, uncooked cheese, cat litter boxes, and soil used by cats. These carry germs that can cause birth defects in the baby. °· Take your prenatal vitamins. °· Take 1500-2000 mg of calcium daily starting at the 20th week of pregnancy until you deliver your baby. °· Try taking a stool softener (if your caregiver approves) if you develop constipation. Eat more high-fiber foods, such as fresh vegetables or fruit and whole grains. Drink plenty of fluids to keep your urine clear or pale yellow. °· Take warm sitz baths to soothe any pain or discomfort caused by hemorrhoids. Use hemorrhoid cream if your caregiver approves. °· If you develop varicose veins, wear support hose. Elevate your feet for 15 minutes, 3-4 times a day. Limit salt in your diet. °· Avoid heavy lifting, wear low heal shoes, and practice good posture. °· Rest a lot with your legs elevated if you have leg cramps or low back pain. °· Visit your dentist if you have not gone during your pregnancy. Use a  soft toothbrush to brush your teeth and be gentle when you floss. °· A sexual relationship may be continued unless your caregiver directs you otherwise. °· Do not travel far distances unless it is absolutely necessary and only with the approval of your caregiver. °· Take prenatal classes to understand, practice, and ask questions about the labor and delivery. °· Make a trial run to the hospital. °· Pack your hospital bag. °· Prepare the baby's nursery. °· Continue to go to all your prenatal visits as directed by your caregiver. °SEEK MEDICAL CARE   IF: °· You are unsure if you are in labor or if your water has broken. °· You have dizziness. °· You have mild pelvic cramps, pelvic pressure, or nagging pain in your abdominal area. °· You have persistent nausea, vomiting, or diarrhea. °· You have a bad smelling vaginal discharge. °· You have pain with urination. °SEEK IMMEDIATE MEDICAL CARE IF:  °· You have a fever. °· You are leaking fluid from your vagina. °· You have spotting or bleeding from your vagina. °· You have severe abdominal cramping or pain. °· You have rapid weight loss or gain. °· You have shortness of breath with chest pain. °· You notice sudden or extreme swelling of your face, hands, ankles, feet, or legs. °· You have not felt your baby move in over an hour. °· You have severe headaches that do not go away with medicine. °· You have vision changes. °  °This information is not intended to replace advice given to you by your health care provider. Make sure you discuss any questions you have with your health care provider. °  °Document Released: 05/08/2001 Document Revised: 06/04/2014 Document Reviewed: 07/15/2012 °Elsevier Interactive Patient Education ©2016 [employer] ° °

## 2015-04-08 NOTE — MAU Note (Signed)
Pt presents contractions q 3 minutes. Denies bleeding or ROM

## 2015-04-08 NOTE — Anesthesia Procedure Notes (Signed)
Epidural Patient location during procedure: OB  Staffing Anesthesiologist: Aldora Perman, [place] Performed by: anesthesiologist   Preanesthetic Checklist Completed: patient identified, site marked, surgical consent, pre-op evaluation, timeout performed, IV checked, risks and benefits discussed and monitors and equipment checked  Epidural Patient position: sitting Prep: site prepped and draped and DuraPrep Patient monitoring: continuous pulse ox and blood pressure Approach: midline Location: L3-L4 Injection technique: LOR saline  Needle:  Needle type: Tuohy  Needle gauge: 17 G Needle length: 9 cm and 9 Needle insertion depth: 6 cm Catheter type: closed end flexible Catheter size: 19 Gauge Catheter at skin depth: 10 cm Test dose: negative  Assessment Events: blood not aspirated, injection not painful, no injection resistance, negative IV test and no paresthesia  Additional Notes Patient identified. Risks/Benefits/Options discussed with patient including but not limited to bleeding, infection, nerve damage, paralysis, failed block, incomplete pain control, headache, blood pressure changes, nausea, vomiting, reactions to medication both or allergic, itching and postpartum back pain. Confirmed with bedside nurse the patient's most recent platelet count. Confirmed with patient that they are not currently taking any anticoagulation, have any bleeding history or any family history of bleeding disorders. Patient expressed understanding and wished to proceed. All questions were answered. Sterile technique was used throughout the entire procedure. Please see nursing notes for vital signs. Test dose was given through epidural catheter and negative prior to continuing to dose epidural or start infusion. Warning signs of high block given to the patient including shortness of breath, tingling/numbness in hands, complete motor block, or any concerning symptoms with instructions to call for help. Patient was  given instructions on fall risk and not to get out of bed. All questions and concerns addressed with instructions to call with any issues or inadequate analgesia.    

## 2015-04-08 NOTE — Lactation Note (Signed)
This note was copied from the chart of Dawn Trenton GammonMary Asano. Lactation Consultation Note  Patient Name: Dawn Moss JYNWG'NToday's Date: 04/08/2015 Reason for consult: Initial assessment Experienced bf mom that reports feeding are going well. No questions or concerns at this time. Discussed feeding frequency, voids, wt loss, baby behavior, milk transition, breast changes, and nipple care. Mom does know how to manually express and stated that she has a lot of colostrum. Given lactation handouts. She is aware of OP services and support group.    Maternal Data Has patient been taught Hand Expression?: Yes Does the patient have breastfeeding experience prior to this delivery?: Yes  Feeding Feeding Type: Breast Fed Length of feed: 15 min  LATCH Score/Interventions Latch: Grasps breast easily, tongue down, lips flanged, rhythmical sucking.  Audible Swallowing: Spontaneous and intermittent Intervention(s): Skin to skin;Hand expression  Type of Nipple: Everted at rest and after stimulation  Comfort (Breast/Nipple): Soft / non-tender     Hold (Positioning): No assistance needed to correctly position infant at breast.  LATCH Score: 10  Lactation Tools Discussed/Used WIC Program: No   Consult Status Consult Status: PRN    Dawn Moss 04/08/2015, 9:26 PM

## 2015-04-08 NOTE — H&P (Signed)
39 yo G3P2002 (SVD then PCS for breech) presents with contractions since membranes stripped in office this afternoon. Contractions worsening at midnight today. Pt wants to make sure she doesn't miss window for epidural. No LOF, no VB, no fevers, good FM.  OBHx: SVD then PCS breech and now planning TOLAC AGA GBS neg  PE  Vitals per nurse  Gen: well appearing, no distress Abd: soft, gravid, EFW 7# GU: 3/80% vtx per nurse  Toco: q 5 min Fh: 140s, + accels, no decels, 10 beat var  A/P: Latent labor, walk then reassess for active labor  Dawn Moss A. 04/08/2015 2:51 AM

## 2015-04-08 NOTE — Anesthesia Preprocedure Evaluation (Signed)
Anesthesia Evaluation  Patient identified by MRN, date of birth, ID band Patient awake    Reviewed: Allergy & Precautions, NPO status , Patient's Chart, lab work & pertinent test results  History of Anesthesia Complications Negative for: history of anesthetic complications  Airway Mallampati: II  TM Distance: >3 FB Neck ROM: Full    Dental no notable dental hx. (+) Dental Advisory Given   Pulmonary former smoker,    Pulmonary exam normal breath sounds clear to auscultation       Cardiovascular negative cardio ROS Normal cardiovascular exam Rhythm:Regular Rate:Normal     Neuro/Psych  Headaches, PSYCHIATRIC DISORDERS Anxiety Depression    GI/Hepatic negative GI ROS, Neg liver ROS,   Endo/Other  Hypothyroidism   Renal/GU negative Renal ROS  negative genitourinary   Musculoskeletal negative musculoskeletal ROS (+)   Abdominal   Peds negative pediatric ROS (+)  Hematology negative hematology ROS (+)   Anesthesia Other Findings   Reproductive/Obstetrics (+) Pregnancy                             Anesthesia Physical Anesthesia Plan  ASA: II  Anesthesia Plan: Epidural   Post-op Pain Management:    Induction:   Airway Management Planned:   Additional Equipment:   Intra-op Plan:   Post-operative Plan:   Informed Consent: I have reviewed the patients History and Physical, chart, labs and discussed the procedure including the risks, benefits and alternatives for the proposed anesthesia with the patient or authorized representative who has indicated his/her understanding and acceptance.   Dental advisory given  Plan Discussed with: CRNA  Anesthesia Plan Comments:         Anesthesia Quick Evaluation

## 2015-04-08 NOTE — Progress Notes (Signed)
S: comfortable with epidural  O:  VS: Blood pressure 119/70, pulse 98, resp. rate 16, last menstrual period 07/10/2014, SpO2 100 %, currently breastfeeding.        FHR : baseline 145 / variability moderate / accelerations 10x10 / no decelerations        Toco: contractions every 2-4 minutes /        Cervix : 7cm  90% . vtx  0 station        Membranes: BBOW - AROM - clear  A: acive labor     FHR category 1  P: anticipate SVB in next 2-3 hours     Dawn Moss, Dawn Moss CNM, MSN, [facility] SpecialistsFACNM 04/08/2015, 12:41 PM

## 2015-04-09 LAB — CBC
HCT: 34.2 % — ABNORMAL LOW (ref 36.0–46.0)
Hemoglobin: 11.6 g/dL — ABNORMAL LOW (ref 12.0–15.0)
MCH: 32.1 pg (ref 26.0–34.0)
MCHC: 33.9 g/dL (ref 30.0–36.0)
MCV: 94.7 fL (ref 78.0–100.0)
Platelets: 154 10*3/uL (ref 150–400)
RBC: 3.61 MIL/uL — ABNORMAL LOW (ref 3.87–5.11)
RDW: 15.5 % (ref 11.5–15.5)
WBC: 9.1 10*3/uL (ref 4.0–10.5)

## 2015-04-09 LAB — RPR: RPR Ser Ql: NONREACTIVE

## 2015-04-09 MED ORDER — IBUPROFEN 600 MG PO TABS: 600.0000 mg | ORAL_TABLET | Freq: Four times a day (QID) | ORAL | Status: DC

## 2015-04-09 MED ORDER — OXYCODONE-ACETAMINOPHEN 5-325 MG PO TABS: 1.0000 | ORAL_TABLET | ORAL | Status: DC | PRN

## 2015-04-09 NOTE — Discharge Instructions (Signed)
Breast Pumping Tips °If you are breastfeeding, there may be times when you cannot feed your baby directly. Returning to work or going on a trip are common examples. Pumping allows you to store breast milk and feed it to your baby later.  °You may not get much milk when you first start to pump. Your breasts should start to make more after a few days. If you pump at the times you usually feed your baby, you may be able to keep making enough milk to feed your baby without also using formula. The more often you pump, the more milk you will produce.  °WHEN SHOULD I PUMP?  °· You can begin to pump soon after delivery. However, some experts recommend waiting about 4 weeks before giving your infant a bottle to make sure breastfeeding is going well.  °· If you plan to return to work, begin pumping a few weeks before. This will help you develop techniques that work best for you. It also lets you build up a supply of breast milk.   °· When you are with your infant, feed on demand and pump after each feeding.   °· When you are away from your infant for several hours, pump for about 15 minutes every 2-3 hours. Pump both breasts at the same time if you can.   °· If your infant has a formula feeding, make sure to pump around the same time.     °· If you drink any alcohol, wait 2 hours before pumping.   °HOW DO I PREPARE TO PUMP? °Your let-down reflex is the natural reaction to stimulation that makes your breast milk flow. It is easier to stimulate this reflex when you are relaxed. Find relaxation techniques that work for you. If you have difficulty with your let-down reflex, try these methods:  °· Smell one of your infant's blankets or an item of clothing.   °· Look at a picture or video of your infant.   °· Sit in a quiet, private space.   °· Massage the breast you plan to pump.   °· Place soothing warmth on the breast.   °· Play relaxing music.   °WHAT ARE SOME GENERAL BREAST PUMPING TIPS? °· Wash your hands before you pump. You  do not need to wash your nipples or breasts. °· There are three ways to pump. °¨ You can use your hand to massage and compress your breast. °¨ You can use a handheld manual pump. °¨ You can use an electric pump.   °· Make sure the suction cup (flange) on the breast pump is the right size. Place the flange directly over the nipple. If it is the wrong size or placed the wrong way, it may be painful and cause nipple damage.   °· If pumping is uncomfortable, apply a small amount of purified or modified lanolin to your nipple and areola. °· If you are using an electric pump, adjust the speed and suction power to be more comfortable. °· If pumping is painful or if you are not getting very much milk, you may need a different type of pump. A lactation consultant can help you determine what type of pump to use.   °· Keep a full water bottle near you at all times. Drinking lots of fluid helps you make more milk.  °· You can store your milk to use later. Pumped breast milk can be stored in a sealable, sterile container or plastic bag. Label all stored breast milk with the date you pumped it. °¨ Milk can stay out at room temperature for up to 8 hours. °¨   You can store your milk in the refrigerator for up to 8 days. °¨ You can store your milk in the freezer for 3 months. Thaw frozen milk using warm water. Do not put it in the microwave. °· Do not smoke. Smoking can lower your milk supply and harm your infant. If you need help quitting, ask your health care provider to recommend a program.   °WHEN SHOULD I CALL MY HEALTH CARE PROVIDER OR A LACTATION CONSULTANT? °· You are having trouble pumping. °· You are concerned that you are not making enough milk. °· You have nipple pain, soreness, or redness. °· You want to use birth control. Birth control pills may lower your milk supply. Talk to your health care provider about your options. °  °This information is not intended to replace advice given to you by your health care provider.  Make sure you discuss any questions you have with your health care provider. °  °Document Released: 11/01/2009 Document Revised: 05/19/2013 Document Reviewed: 03/06/2013 °[employer]vier Interactive Patient Education ©2016 [employer] °Postpartum Depression and Baby Blues °The postpartum period begins right after the birth of a baby. During this time, there is often a great amount of joy and excitement. It is also a time of many changes in the life of the parents. Regardless of how many times a mother gives birth, each child brings new challenges and dynamics to the family. It is not unusual to have feelings of excitement along with confusing shifts in moods, emotions, and thoughts. All mothers are at risk of developing postpartum depression or the "baby blues." These mood changes can occur right after giving birth, or they may occur many months after giving birth. The baby blues or postpartum depression can be mild or severe. Additionally, postpartum depression can go away rather quickly, or it can be a long-term condition.  °CAUSES °Raised hormone levels and the rapid drop in those levels are thought to be a main cause of postpartum depression and the baby blues. A number of hormones change during and after pregnancy. Estrogen and progesterone usually decrease right after the delivery of your baby. The levels of thyroid hormone and various cortisol steroids also rapidly drop. Other factors that play a role in these mood changes include major life events and genetics.  °RISK FACTORS °If you have any of the following risks for the baby blues or postpartum depression, know what symptoms to watch out for during the postpartum period. Risk factors that may increase the likelihood of getting the baby blues or postpartum depression include: °· Having a personal or family history of depression.   °· Having depression while being pregnant.   °· Having premenstrual mood issues or mood issues related to oral  contraceptives. °· Having a lot of life stress.   °· Having marital conflict.   °· Lacking a social support network.   °· Having a baby with special needs.   °· Having health problems, such as diabetes.   °SIGNS AND SYMPTOMS °Symptoms of baby blues include: °· Brief changes in mood, such as going from extreme happiness to sadness. °· Decreased concentration.   °· Difficulty sleeping.   °· Crying spells, tearfulness.   °· Irritability.   °· Anxiety.   °Symptoms of postpartum depression typically begin within the first month after giving birth. These symptoms include: °· Difficulty sleeping or excessive sleepiness.   °· Marked weight loss.   °· Agitation.   °· Feelings of worthlessness.   °· Lack of interest in activity or food.   °Postpartum psychosis is a very serious condition and can be dangerous. Fortunately, it is   rare. Displaying any of the following symptoms is cause for immediate medical attention. Symptoms of postpartum psychosis include:  °· Hallucinations and delusions.   °· Bizarre or disorganized behavior.   °· Confusion or disorientation.   °DIAGNOSIS  °A diagnosis is made by an evaluation of your symptoms. There are no medical or lab tests that lead to a diagnosis, but there are various questionnaires that a health care provider may use to identify those with the baby blues, postpartum depression, or psychosis. Often, a screening tool called the Edinburgh Postnatal Depression Scale is used to diagnose depression in the postpartum period.  °TREATMENT °The baby blues usually goes away on its own in 1-2 weeks. Social support is often all that is needed. You will be encouraged to get adequate sleep and rest. Occasionally, you may be given medicines to help you sleep.  °Postpartum depression requires treatment because it can last several months or longer if it is not treated. Treatment may include individual or group therapy, medicine, or both to address any social, physiological, and psychological factors  that may play a role in the depression. Regular exercise, a healthy diet, rest, and social support may also be strongly recommended.  °Postpartum psychosis is more serious and needs treatment right away. Hospitalization is often needed. °HOME CARE INSTRUCTIONS °· Get as much rest as you can. Nap when the baby sleeps.   °· Exercise regularly. Some women find yoga and walking to be beneficial.   °· Eat a balanced and nourishing diet.   °· Do little things that you enjoy. Have a cup of tea, take a bubble bath, read your favorite magazine, or listen to your favorite music. °· Avoid alcohol.   °· Ask for help with household chores, cooking, grocery shopping, or running errands as needed. Do not try to do everything.   °· Talk to people close to you about how you are feeling. Get support from your partner, family members, friends, or other new moms. °· Try to stay positive in how you think. Think about the things you are grateful for.   °· Do not spend a lot of time alone.   °· Only take over-the-counter or prescription medicine as directed by your health care provider. °· Keep all your postpartum appointments.   °· Let your health care provider know if you have any concerns.   °SEEK MEDICAL CARE IF: °You are having a reaction to or problems with your medicine. °SEEK IMMEDIATE MEDICAL CARE IF: °· You have suicidal feelings.   °· You think you may harm the baby or someone else. °MAKE SURE YOU: °· Understand these instructions. °· Will watch your condition. °· Will get help right away if you are not doing well or get worse. °  °This information is not intended to replace advice given to you by your health care provider. Make sure you discuss any questions you have with your health care provider. °  °Document Released: 02/16/2004 Document Revised: 05/19/2013 Document Reviewed: 02/23/2013 °Elsevier Interactive Patient Education ©2016 [employer] °Postpartum Care After Vaginal Delivery °After you deliver your newborn  (postpartum period), the usual stay in the hospital is 24-72 hours. If there were problems with your labor or delivery, or if you have other medical problems, you might be in the hospital longer.  °While you are in the hospital, you will receive help and instructions on how to care for yourself and your newborn during the postpartum period.  °While you are in the hospital: °· Be sure to tell your nurses if you have pain or discomfort, as well as   where you feel the pain and what makes the pain worse. °· If you had an incision made near your vagina (episiotomy) or if you had some tearing during delivery, the nurses may put ice packs on your episiotomy or tear. The ice packs may help to reduce the pain and swelling. °· If you are breastfeeding, you may feel uncomfortable contractions of your uterus for a couple of weeks. This is normal. The contractions help your uterus get back to normal size. °· It is normal to have some bleeding after delivery. °¨ For the first 1-3 days after delivery, the flow is red and the amount may be similar to a period. °¨ It is common for the flow to start and stop. °¨ In the first few days, you may pass some small clots. Let your nurses know if you begin to pass large clots or your flow increases. °¨ Do not  flush blood clots down the toilet before having the nurse look at them. °¨ During the next 3-10 days after delivery, your flow should become more watery and pink or brown-tinged in color. °¨ Ten to fourteen days after delivery, your flow should be a small amount of yellowish-white discharge. °¨ The amount of your flow will decrease over the first few weeks after delivery. Your flow may stop in 6-8 weeks. Most women have had their flow stop by 12 weeks after delivery. °· You should change your sanitary pads frequently. °· Wash your hands thoroughly with soap and water for at least 20 seconds after changing pads, using the toilet, or before holding or feeding your newborn. °· You should  feel like you need to empty your bladder within the first 6-8 hours after delivery. °· In case you become weak, lightheaded, or faint, call your nurse before you get out of bed for the first time and before you take a shower for the first time. °· Within the first few days after delivery, your breasts may begin to feel tender and full. This is called engorgement. Breast tenderness usually goes away within 48-72 hours after engorgement occurs. You may also notice milk leaking from your breasts. If you are not breastfeeding, do not stimulate your breasts. Breast stimulation can make your breasts produce more milk. °· Spending as much time as possible with your newborn is very important. During this time, you and your newborn can feel close and get to know each other. Having your newborn stay in your room (rooming in) will help to strengthen the bond with your newborn.  It will give you time to get to know your newborn and become comfortable caring for your newborn. °· Your hormones change after delivery. Sometimes the hormone changes can temporarily cause you to feel sad or tearful. These feelings should not last more than a few days. If these feelings last longer than that, you should talk to your caregiver. °· If desired, talk to your caregiver about methods of family planning or contraception. °· Talk to your caregiver about immunizations. Your caregiver may want you to have the following immunizations before leaving the hospital: °¨ Tetanus, diphtheria, and pertussis (Tdap) or tetanus and diphtheria (Td) immunization. It is very important that you and your family (including grandparents) or others caring for your newborn are up-to-date with the Tdap or Td immunizations. The Tdap or Td immunization can help protect your newborn from getting ill. °¨ Rubella immunization. °¨ Varicella (chickenpox) immunization. °¨ Influenza immunization. You should receive this annual immunization if you did not receive the    immunization during your pregnancy. °  °This information is not intended to replace advice given to you by your health care provider. Make sure you discuss any questions you have with your health care provider. °  °Document Released: 03/11/2007 Document Revised: 02/06/2012 Document Reviewed: 01/09/2012 °[employer]vier Interactive Patient Education ©2016 [employer] °Breastfeeding and Mastitis °Mastitis is inflammation of the breast tissue. It can occur in women who are breastfeeding. This can make breastfeeding painful. Mastitis will sometimes go away on its own. Your health care provider will help determine if treatment is needed. °CAUSES °Mastitis is often associated with a blocked milk (lactiferous) duct. This can happen when too much milk builds up in the breast. Causes of excess milk in the breast can include: °· Poor latch-on. If your baby is not latched onto the breast properly, she or he may not empty your breast completely while breastfeeding. °· Allowing too much time to pass between feedings. °· Wearing a bra or other clothing that is too tight. This puts extra pressure on the lactiferous ducts so milk does not flow through them as it should. °Mastitis can also be caused by a bacterial infection. Bacteria may enter the breast tissue through cuts or openings in the skin. In women who are breastfeeding, this may occur because of cracked or irritated skin. Cracks in the skin are often caused when your baby does not latch on properly to the breast. °SIGNS AND SYMPTOMS °· Swelling, redness, tenderness, and pain in an area of the breast. °· Swelling of the glands under the arm on the same side. °· Fever may or may not accompany mastitis. °If an infection is allowed to progress, a collection of pus (abscess) may develop. °DIAGNOSIS  °Your health care provider can usually diagnose mastitis based on your symptoms and a physical exam. Tests may be done to help confirm the diagnosis. These may include: °· Removal of pus  from the breast by applying pressure to the area. This pus can be examined in the lab to determine which bacteria are present. If an abscess has developed, the fluid in the abscess can be removed with a needle. This can also be used to confirm the diagnosis and determine the bacteria present. In most cases, pus will not be present. °· Blood tests to determine if your body is fighting a bacterial infection. °· Mammogram or ultrasound tests to rule out other problems or diseases. °TREATMENT  °Mastitis that occurs with breastfeeding will sometimes go away on its own. Your health care provider may choose to wait 24 hours after first seeing you to decide whether a prescription medicine is needed. If your symptoms are worse after 24 hours, your health care provider will likely prescribe an antibiotic medicine to treat the mastitis. He or she will determine which bacteria are most likely causing the infection and will then select an appropriate antibiotic medicine. This is sometimes changed based on the results of tests performed to identify the bacteria, or if there is no response to the antibiotic medicine selected. Antibiotic medicines are usually given by mouth. You may also be given medicine for pain. °HOME CARE INSTRUCTIONS °· Only take over-the-counter or prescription medicines for pain, fever, or discomfort as directed by your health care provider. °· If your health care provider prescribed an antibiotic medicine, take the medicine as directed. Make sure you finish it even if you start to feel better. °· Do not wear a tight or underwire bra. Wear a soft, supportive bra. °· Increase your fluid   intake, especially if you have a fever. °· Continue to empty the breast. Your health care provider can tell you whether this milk is safe for your infant or needs to be thrown out. You may be told to stop nursing until your health care provider thinks it is safe for your baby. Use a breast pump if you are advised to stop  nursing. °· Keep your nipples clean and dry. °· Empty the first breast completely before going to the other breast. If your baby is not emptying your breasts completely for some reason, use a breast pump to empty your breasts. °· If you go back to work, pump your breasts while at work to stay in time with your nursing schedule. °· Avoid allowing your breasts to become overly filled with milk (engorged). °SEEK MEDICAL CARE IF: °· You have pus-like discharge from the breast. °· Your symptoms do not improve with the treatment prescribed by your health care provider within 2 days. °SEEK IMMEDIATE MEDICAL CARE IF: °· Your pain and swelling are getting worse. °· You have pain that is not controlled with medicine. °· You have a red line extending from the breast toward your armpit. °· You have a fever or persistent symptoms for more than 2-3 days. °· You have a fever and your symptoms suddenly get worse. °MAKE SURE YOU:  °· Understand these instructions. °· Will watch your condition. °· Will get help right away if you are not doing well or get worse. °  °This information is not intended to replace advice given to you by your health care provider. Make sure you discuss any questions you have with your health care provider. °  °Document Released: 09/08/2004 Document Revised: 05/19/2013 Document Reviewed: 12/18/2012 °[employer]vier Interactive Patient Education ©2016 [employer] ° °Breastfeeding °Deciding to breastfeed is one of the best choices you can make for you and your baby. A change in hormones during pregnancy causes your breast tissue to grow and increases the number and size of your milk ducts. These hormones also allow proteins, sugars, and fats from your blood supply to make breast milk in your milk-producing glands. Hormones prevent breast milk from being released before your baby is born as well as prompt milk flow after birth. Once breastfeeding has begun, thoughts of your baby, as well as his or her sucking or  crying, can stimulate the release of milk from your milk-producing glands.  °BENEFITS OF BREASTFEEDING °For Your Baby °· Your first milk (colostrum) helps your baby's digestive system function better. °· There are antibodies in your milk that help your baby fight off infections. °· Your baby has a lower incidence of asthma, allergies, and sudden infant death syndrome. °· The nutrients in breast milk are better for your baby than infant formulas and are designed uniquely for your baby's needs. °· Breast milk improves your baby's brain development. °· Your baby is less likely to develop other conditions, such as childhood obesity, asthma, or type 2 diabetes mellitus. °For You °· Breastfeeding helps to create a very special bond between you and your baby. °· Breastfeeding is convenient. Breast milk is always available at the correct temperature and costs nothing. °· Breastfeeding helps to burn calories and helps you lose the weight gained during pregnancy. °· Breastfeeding makes your uterus contract to its prepregnancy size faster and slows bleeding (lochia) after you give birth.   °· Breastfeeding helps to lower your risk of developing type 2 diabetes mellitus, osteoporosis, and breast or ovarian cancer later in life. °  SIGNS THAT YOUR BABY IS HUNGRY °Early Signs of Hunger °· Increased alertness or activity. °· Stretching. °· Movement of the head from side to side. °· Movement of the head and opening of the mouth when the corner of the mouth or cheek is stroked (rooting). °· Increased sucking sounds, smacking lips, cooing, sighing, or squeaking. °· Hand-to-mouth movements. °· Increased sucking of fingers or hands. °Late Signs of Hunger °· Fussing. °· Intermittent crying. °Extreme Signs of Hunger °Signs of extreme hunger will require calming and consoling before your baby will be able to breastfeed successfully. Do not wait for the following signs of extreme hunger to occur before you initiate  breastfeeding: °· Restlessness. °· A loud, strong cry. °· Screaming. °BREASTFEEDING BASICS °Breastfeeding Initiation °· Find a comfortable place to sit or lie down, with your neck and back well supported. °· Place a pillow or rolled up blanket under your baby to bring him or her to the level of your breast (if you are seated). Nursing pillows are specially designed to help support your arms and your baby while you breastfeed. °· Make sure that your baby's abdomen is facing your abdomen. °· Gently massage your breast. With your fingertips, massage from your chest wall toward your nipple in a circular motion. This encourages milk flow. You may need to continue this action during the feeding if your milk flows slowly. °· Support your breast with 4 fingers underneath and your thumb above your nipple. Make sure your fingers are well away from your nipple and your baby's mouth. °· Stroke your baby's lips gently with your finger or nipple. °· When your baby's mouth is open wide enough, quickly bring your baby to your breast, placing your entire nipple and as much of the colored area around your nipple (areola) as possible into your baby's mouth. °¨ More areola should be visible above your baby's upper lip than below the lower lip. °¨ Your baby's tongue should be between his or her lower gum and your breast. °· Ensure that your baby's mouth is correctly positioned around your nipple (latched). Your baby's lips should create a seal on your breast and be turned out (everted). °· It is common for your baby to suck about 2-3 minutes in order to start the flow of breast milk. °Latching °Teaching your baby how to latch on to your breast properly is very important. An improper latch can cause nipple pain and decreased milk supply for you and poor weight gain in your baby. Also, if your baby is not latched onto your nipple properly, he or she may swallow some air during feeding. This can make your baby fussy. Burping your baby when  you switch breasts during the feeding can help to get rid of the air. However, teaching your baby to latch on properly is still the best way to prevent fussiness from swallowing air while breastfeeding. °Signs that your baby has successfully latched on to your nipple: °· Silent tugging or silent sucking, without causing you pain. °· Swallowing heard between every 3-4 sucks. °· Muscle movement above and in front of his or her ears while sucking. °Signs that your baby has not successfully latched on to nipple: °· Sucking sounds or smacking sounds from your baby while breastfeeding. °· Nipple pain. °If you think your baby has not latched on correctly, slip your finger into the corner of your baby's mouth to break the suction and place it between your baby's gums. Attempt breastfeeding initiation again. °Signs of Successful Breastfeeding °  Signs from your baby: °· A gradual decrease in the number of sucks or complete cessation of sucking. °· Falling asleep. °· Relaxation of his or her body. °· Retention of a small amount of milk in his or her mouth. °· Letting go of your breast by himself or herself. °Signs from you: °· Breasts that have increased in firmness, weight, and size 1-3 hours after feeding. °· Breasts that are softer immediately after breastfeeding. °· Increased milk volume, as well as a change in milk consistency and color by the fifth day of breastfeeding. °· Nipples that are not sore, cracked, or bleeding. °Signs That Your Baby is Getting Enough Milk °· Wetting at least 3 diapers in a 24-hour period. The urine should be clear and pale yellow by age 5 days. °· At least 3 stools in a 24-hour period by age 5 days. The stool should be soft and yellow. °· At least 3 stools in a 24-hour period by age 7 days. The stool should be seedy and yellow. °· No loss of weight greater than 10% of birth weight during the first 3 days of age. °· Average weight gain of 4-7 ounces (113-198 g) per week after age 4  days. °· Consistent daily weight gain by age 5 days, without weight loss after the age of 2 weeks. °After a feeding, your baby may spit up a small amount. This is common. °BREASTFEEDING FREQUENCY AND DURATION °Frequent feeding will help you make more milk and can prevent sore nipples and breast engorgement. Breastfeed when you feel the need to reduce the fullness of your breasts or when your baby shows signs of hunger. This is called "breastfeeding on demand." Avoid introducing a pacifier to your baby while you are working to establish breastfeeding (the first 4-6 weeks after your baby is born). After this time you may choose to use a pacifier. Research has shown that pacifier use during the first year of a baby's life decreases the risk of sudden infant death syndrome (SIDS). °Allow your baby to feed on each breast as long as he or she wants. Breastfeed until your baby is finished feeding. When your baby unlatches or falls asleep while feeding from the first breast, offer the second breast. Because newborns are often sleepy in the first few weeks of life, you may need to awaken your baby to get him or her to feed. °Breastfeeding times will vary from baby to baby. However, the following rules can serve as a guide to help you ensure that your baby is properly fed: °· Newborns (babies 4 weeks of age or younger) may breastfeed every 1-3 hours. °· Newborns should not go longer than 3 hours during the day or 5 hours during the night without breastfeeding. °· You should breastfeed your baby a minimum of 8 times in a 24-hour period until you begin to introduce solid foods to your baby at around 6 months of age. °BREAST MILK PUMPING °Pumping and storing breast milk allows you to ensure that your baby is exclusively fed your breast milk, even at times when you are unable to breastfeed. This is especially important if you are going back to work while you are still breastfeeding or when you are not able to be present during  feedings. Your lactation consultant can give you guidelines on how long it is safe to store breast milk. °A breast pump is a machine that allows you to pump milk from your breast into a sterile bottle. The pumped breast milk can   then be stored in a refrigerator or freezer. Some breast pumps are operated by hand, while others use electricity. Ask your lactation consultant which type will work best for you. Breast pumps can be purchased, but some hospitals and breastfeeding support groups lease breast pumps on a monthly basis. A lactation consultant can teach you how to hand express breast milk, if you prefer not to use a pump. °CARING FOR YOUR BREASTS WHILE YOU BREASTFEED °Nipples can become dry, cracked, and sore while breastfeeding. The following recommendations can help keep your breasts moisturized and healthy: °· Avoid using soap on your nipples. °· Wear a supportive bra. Although not required, special nursing bras and tank tops are designed to allow access to your breasts for breastfeeding without taking off your entire bra or top. Avoid wearing underwire-style bras or extremely tight bras. °· Air dry your nipples for 3-4 minutes after each feeding. °· Use only cotton bra pads to absorb leaked breast milk. Leaking of breast milk between feedings is normal. °· Use lanolin on your nipples after breastfeeding. Lanolin helps to maintain your skin's normal moisture barrier. If you use pure lanolin, you do not need to wash it off before feeding your baby again. Pure lanolin is not toxic to your baby. You may also hand express a few drops of breast milk and gently massage that milk into your nipples and allow the milk to air dry. °In the first few weeks after giving birth, some women experience extremely full breasts (engorgement). Engorgement can make your breasts feel heavy, warm, and tender to the touch. Engorgement peaks within 3-5 days after you give birth. The following recommendations can help ease  engorgement: °· Completely empty your breasts while breastfeeding or pumping. You may want to start by applying warm, moist heat (in the shower or with warm water-soaked hand towels) just before feeding or pumping. This increases circulation and helps the milk flow. If your baby does not completely empty your breasts while breastfeeding, pump any extra milk after he or she is finished. °· Wear a snug bra (nursing or regular) or tank top for 1-2 days to signal your body to slightly decrease milk production. °· Apply ice packs to your breasts, unless this is too uncomfortable for you. °· Make sure that your baby is latched on and positioned properly while breastfeeding. °If engorgement persists after 48 hours of following these recommendations, contact your health care provider or a lactation consultant. °OVERALL HEALTH CARE RECOMMENDATIONS WHILE BREASTFEEDING °· Eat healthy foods. Alternate between meals and snacks, eating 3 of each per day. Because what you eat affects your breast milk, some of the foods may make your baby more irritable than usual. Avoid eating these foods if you are sure that they are negatively affecting your baby. °· Drink milk, fruit juice, and water to satisfy your thirst (about 10 glasses a day). °· Rest often, relax, and continue to take your prenatal vitamins to prevent fatigue, stress, and anemia. °· Continue breast self-awareness checks. °· Avoid chewing and smoking tobacco. Chemicals from cigarettes that pass into breast milk and exposure to secondhand smoke may harm your baby. °· Avoid alcohol and drug use, including marijuana. °Some medicines that may be harmful to your baby can pass through breast milk. It is important to ask your health care provider before taking any medicine, including all over-the-counter and prescription medicine as well as vitamin and herbal supplements. °It is possible to become pregnant while breastfeeding. If birth control is desired, ask your health care    provider about options that will be safe for your baby. °SEEK MEDICAL CARE IF: °· You feel like you want to stop breastfeeding or have become frustrated with breastfeeding. °· You have painful breasts or nipples. °· Your nipples are cracked or bleeding. °· Your breasts are red, tender, or warm. °· You have a swollen area on either breast. °· You have a fever or chills. °· You have nausea or vomiting. °· You have drainage other than breast milk from your nipples. °· Your breasts do not become full before feedings by the fifth day after you give birth. °· You feel sad and depressed. °· Your baby is too sleepy to eat well. °· Your baby is having trouble sleeping.   °· Your baby is wetting less than 3 diapers in a 24-hour period. °· Your baby has less than 3 stools in a 24-hour period. °· Your baby's skin or the white part of his or her eyes becomes yellow.   °· Your baby is not gaining weight by 5 days of age. °SEEK IMMEDIATE MEDICAL CARE IF: °· Your baby is overly tired (lethargic) and does not want to wake up and feed. °· Your baby develops an unexplained fever. °  °This information is not intended to replace advice given to you by your health care provider. Make sure you discuss any questions you have with your health care provider. °  °Document Released: 05/14/2005 Document Revised: 02/02/2015 Document Reviewed: 11/05/2012 °Elsevier Interactive Patient Education ©2016 [employer] ° °

## 2015-04-09 NOTE — Anesthesia Postprocedure Evaluation (Signed)
  Anesthesia Post-op Note  Patient: Dawn Moss  Procedure(s) Performed: * No procedures listed *  Patient Location: PACU and Mother/Baby  Anesthesia Type:Epidural  Level of Consciousness: awake, alert  and oriented  Airway and Oxygen Therapy: Patient Spontanous Breathing  Post-op Pain: none  Post-op Assessment: Post-op Vital signs reviewed, Patient's Cardiovascular Status Stable, No headache, No backache, No residual numbness and No residual motor weakness  Post-op Vital Signs: Reviewed and stable  Complications: No apparent anesthesia complications

## 2015-04-09 NOTE — Progress Notes (Signed)
Patient ID: [name], female   DOB: 08/17/1975, 39 y.o.   MRN: [id number] PPD # 1 SVD  S:  Reports feeling well. Desires and early discharge home today.             Tolerating po/ No nausea or vomiting             Bleeding is light             Pain controlled with ibuprofen (OTC) and narcotic analgesics including Percocet             Up ad lib / ambulatory / voiding without difficulties    Newborn  Information for the patient's newborn:  [name], Girl Laterica [[id number]][[id number]]  female  breast feeding     O:  A & O x 3, in no apparent distress              VS:  Filed Vitals:   04/08/15 1800 04/08/15 1900 04/08/15 2246 04/09/15 0530  BP: 113/59 114/69 106/62 114/67  Pulse: 97 91 86 70  Temp: 99.3 F (37.4 C) 97.9 F (36.6 C) 98.4 F (36.9 C) 97.7 F (36.5 C)  TempSrc: Oral Oral Oral Oral  Resp: 20 18 18 18   SpO2:  96%      LABS:  Recent Labs  04/08/15 1025 04/09/15 0600  WBC 13.5* 9.1  HGB 13.8 11.6*  HCT 39.1 34.2*  PLT 162 154    Blood type: --/--/O POS (11/11 1025)  Rubella: Immune  I&O: I/O last 3 completed shifts: In: -  Out: 305 [Blood:305]             Lungs: Clear and unlabored  Heart: regular rate and rhythm / no murmurs  Abdomen: soft, non-tender, non-distended             Fundus: firm, non-tender, U-1  Perineum: intact  Lochia: minimal  Extremities: No edema, no calf pain or tenderness, No Homans    A/P: PPD # 1  39 y.o., [id number]   Principal Problem:    Postpartum care following vaginal delivery - vbac (11/11)    Doing well - stable status  Routine post partum orders  Early D/C home today    Arita MissAWSON, Cheyenne Bordeaux, M, MSN, CNM 04/09/2015, 9:00 AM

## 2015-04-09 NOTE — Discharge Summary (Signed)
OB Discharge Summary     Patient Name: Dawn Moss DOB: 10/26/1975 MRN: [id number]  Date of admission: 04/08/2015 Delivering MD: Marlinda Mike   Date of discharge: 04/09/2015  Admitting diagnosis: LABOR Intrauterine pregnancy: [redacted]w[redacted]d     Secondary diagnosis:  Principal Problem:   Postpartum care following vaginal delivery - vbac (11/11)  Additional problems: none     Discharge diagnosis: Term Pregnancy Delivered and VBAC                                                                                                Post partum procedures:none  Augmentation: AROM  Complications: None  Hospital course:  Onset of Labor With Vaginal Delivery     40 y.o. yo G3P3003 at [redacted]w[redacted]d was admitted in Active Laboron 04/08/2015. Patient had an uncomplicated labor course as follows:  Membrane Rupture Time/Date: 12:22 PM ,04/08/2015   Intrapartum Procedures: Episiotomy: None [1]                                         Lacerations:  None [1]  Patient had a delivery of a Viable infant. 04/08/2015  Information for the patient's newborn:  Delsa, Walder [[id number]]  Delivery Method: VBAC, Spontaneous (Filed from Delivery Summary)    Pateint had an uncomplicated postpartum course.  She is ambulating, tolerating a regular diet, passing flatus, and urinating well. Patient is discharged home in stable condition on No discharge date for patient encounter.Marland Kitchen    Physical exam  Filed Vitals:   04/08/15 1800 04/08/15 1900 04/08/15 2246 04/09/15 0530  BP: 113/59 114/69 106/62 114/67  Pulse: 97 91 86 70  Temp: 99.3 F (37.4 C) 97.9 F (36.6 C) 98.4 F (36.9 C) 97.7 F (36.5 C)  TempSrc: Oral Oral Oral Oral  Resp: SpO2:  96%     General: alert, cooperative and no distress Lochia: appropriate Uterine Fundus: firm DVT Evaluation: No evidence of DVT seen on physical exam. Negative Homan's sign. No cords or calf tenderness. No significant calf/ankle edema. Labs: Lab Results   Component Value Date   WBC 9.1 04/09/2015   HGB 11.6* 04/09/2015   HCT 34.2* 04/09/2015   MCV 94.7 04/09/2015   PLT 154 04/09/2015   No flowsheet data found.  Discharge instruction: per After Visit Summary and "Baby and Me Booklet".  After visit meds:    Medication List    TAKE these medications        citalopram 20 MG tablet  Commonly known as:  CELEXA  Take 1 tablet by mouth every day     ibuprofen 600 MG tablet  Commonly known as:  ADVIL,MOTRIN  Take 1 tablet (600 mg total) by mouth every 6 (six) hours.     levothyroxine 25 MCG tablet  Commonly known as:  SYNTHROID, LEVOTHROID  Take 1 daily by mouth qod 2 tabs by mouth qod     loratadine 10 MG tablet  Commonly known as:  CLARITIN  Take 10 mg  by mouth daily.     oxyCODONE-acetaminophen 5-325 MG tablet  Commonly known as:  PERCOCET/ROXICET  Take 1 tablet by mouth every 4 (four) hours as needed (for pain scale 4-7).     prenatal multivitamin Tabs tablet  Take 1 tablet by mouth daily at 12 noon.     psyllium 58.6 % packet  Commonly known as:  METAMUCIL  Take 1 packet by mouth daily.        Diet: routine diet  Activity: Advance as tolerated. Pelvic rest for 6 weeks.   Outpatient follow up:6 weeks Follow up Appt:No future appointments. Follow up Visit:No Follow-up on file.  Postpartum contraception: Undecided  Newborn Data: Live born female on 04/08/2014 Birth Weight: 7 lb 4 oz (3289 g) APGAR: 7, 9  Baby Feeding: Breast Disposition:home with mother   04/09/2015 Raelyn MoraAWSON, Shaleena Crusoe, Judie PetitM, CNM

## 2015-04-14 ENCOUNTER — Ambulatory Visit (HOSPITAL_COMMUNITY)
Admission: RE | Admit: 2015-04-14 | Discharge: 2015-04-14 | Disposition: A | Discharge status: Home / Self Care | Payer: Commercial Managed Care - PPO | Source: Ambulatory Visit | Attending: Obstetrics and Gynecology | Admitting: Obstetrics and Gynecology

## 2015-04-14 NOTE — Lactation Note (Signed)
Lactation Consult  Mother's reason for visit:  Cracked, Sore nipples Visit Type:  Lactation Outpatient Appointment Appointment Notes:  6 day old infant.  Last weight check 3 days ago at Pediatrician office 04/11/15 (6 lbs, 15 oz); today's weight (7 lbs, 2.9 oz) weight gain of ~4 oz in 3 days.  Appropriate weight gain.  Mom states she has been breastfeeding infant but latches are painful.  BF 2 previous babies for 6 months without difficulty.  Concerned because latches are so painful; seeking answers for painful latch.  Mom desires to continue breastfeeding.  Exclusively breastfeeding.  Mom states infant is gassy at times and spits up.  States infant feeds for about 5 minutes and then goes to sleep; mom reports having to arouse infant to keep feeding.  Mom has an abundant milk supply.  During consult - painful latches.  LC gave tips for getting deeper latch and flanging top and bottom lip.  Infant would suck with let-down and then go to sleep.  Total amount transferred was 62 ml - appropriate for 6 days of life however, infant did not keep sucking throughout breastfeeding; only feeding with mom's abundant let-downs.  Tongue restriction noted (see note below) - discussed as r/t feeding.  Encouraged mom to protect milk supply so recommended post-pumping and seeing expert for further follow-up r/t tongue restriction.  (See plan of care below).   Consult:  Initial Lactation Consultant:  Lendon Ka  ________________________________________________________________________ Dawn Moss Name: Dawn Moss Date of Birth: 04/08/2014 Pediatrician: Dr. Eddie Candle Gender: female Gestational Age: [redacted]w[redacted]d (At Birth) Birth Weight: 7 lb 4 oz (3289 g) Weight at Discharge: Weight: 6 lb 15.5 oz (3160 g)Date of Discharge: 04/09/2015 [facility] Weights   04/08/15 1546 04/09/15 0152  Weight: 7 lb 4 oz (3289 g) 6 lb 15.5 oz (3160 g)    Today's Weight  04/14/15 - 7 lbs, 2.9  oz  ________________________________________________________________________  Mother's Name: Dawn Moss Type of delivery:  Vaginally Breastfeeding Experience:  P3; BF 2 previous children for 6 months each; wants to feed this baby "as long as I can" Maternal Medical Conditions:  Thyroid; hypothyroidism;  Infertility with second child Maternal Medications:  Synthroid 25-50 mg alternating days QD  ________________________________________________________________________  Breastfeeding History (Post Discharge) Frequency of breastfeeding:  10-12 times per day (every hours - 2 hrs; night 3-4 hrs) Duration of feeding:  5-15 min  Pumping Frequency:  2x per day Volume:  5-8 oz (150-240 ml)  Infant Intake and Output Assessment Voids:  7-10 in 24 hrs.  Color:  Clear yellow Stools:  3-5 in 24 hrs.  Color:  Samule Dry and Yellow (transitional seedy stools)  ________________________________________________________________________  Maternal Breast Assessment  Breast:  Filling; Compressible Nipple:  Erect and Blister - blood blister noted on left nipple tip.  Nipples appeared slightly pink on tips from abrasions. Pain level:  6 initially with latching; mom states pain does subside as breastfeeding progresses but pain does remain; L pain > R pain Pain interventions:  Breast pump  _______________________________________________________________________ Feeding Assessment/Evaluation  Initial feeding assessment:  Infant's oral assessment:  Variance - Upper lip frenulum semi-wide and thick extending to 1-2 mm from tip of gum line.  LC was not able to flange upper lip to nostrils; infant was uncomfortable with LC flanging lips as evidenced by squirming with flanging.  Sublingual posterior frenulum was deep, thick, and short frenulum.  Posterior frenulum blanched with lifting of tongue and is only visible from manually lifting tongue.  Infant can extend  tongue past gum line, some lateralization  noted with gloved finger, and tongue appears flat with crying (no bowling effect).  When sucking on gloved finger, noted lack of wave-like motion.  Noted a slapping motion with tongue on gloved finger.  LC explained tongue-restriction r/t breastfeeding issues to mom.  Mom was interested in more information; LC referred mom to appropriate websites for more information and encouraged mom to have healthcare professional who is an expert with tongue restrictions to do further evaluation.    Positioning:  Cross cradle Right breast  LATCH documentation:  Latch:  2- grasps breast easily, tongue down, rhythmical sucking with let-down for first 5 minutes.  Manual flanging of upper lip needed with some extra flanging of bottom lip.    Audible swallowing:  2 = Spontaneous and intermittent with let-down  Type of nipple:  2 = Everted at rest and after stimulation  Comfort (Breast/Nipple):  1 = Filling, red/small blisters or bruises, mild/mod discomfort  Hold (Positioning):  1 - minimal assistance with getting infant on deeper and turning body toward mom's more  LATCH score:  8  Attached assessment:  Deep  Lips flanged:  No. Some manual flanging/untucking of lips needed   Lips untucked:  No.  Suck assessment:  Displays both - Infant would suck with let down for 5 minutes and then went to sleep.  Stimulation needed to keep infant sucking on/off sucking for an additional 10 minutes.    Tools:  Comfort gels Instructed on use and cleaning of tool:  Yes.    Pre-feed weight:  3258 g  (7 lb. 2.9 oz.) Post-feed weight:  3310 g (7 lb. 4.7 oz.) Amount transferred:  52 ml Amount supplemented:  0 ml  Additional Feeding Assessment  Positioning:  Cross cradle Left breast  LATCH documentation:  Latch:  2 - Grasp breast easily, manual flanging of lips needed.  Sucking with let-down.  Audible swallowing:  2 = Spontaneous and intermittent  Type of nipple:  2 = Everted at rest and after stimulation  Comfort  (Breast/Nipple):  1 = Filling, red/small blisters or bruises, mild/mod discomfort  Hold (Positioning):  2 = No assistance needed to correctly position infant at breast  LATCH score:  9  Suck assessment:  Displays both  Tools:  Comfort gels Instructed on use and cleaning of tool:  Yes.    Pre-feed weight:  3310 g  (7 lb. 4.7 oz.) Post-feed weight:  3320 g (7 lb. 5.1 oz.) Amount transferred:  10 ml Amount supplemented:  0 ml  Total amount transferred:  62 ml Total supplement given:  0 ml  Lactation Information / Plan of Care 1.  Information on tongue restrictions discussed and referred to websites for further information so mom can make informed decision.  Encouraged mom to further discuss with pediatrician. 2.  Encouraged mom to continue latching with deep latch; adjusting / flanging lips as needed for comfort and increase milk intake. 3.  Encouraged post-pumping 3-4 times per day to protect milk supply.  Discussed risk of decreased milk supply d/t tongue restriction. 4.  Comfort gels given for sore nipples.  Explained use.  Encouraged hand expression after breastfeeding to apply on nipples and allow to dry before applying comfort gels. 5.  Encouraged tummy-time and explained benefits r/t stretching head/neck muscles and improving tongue movement which can help improve comfort of latches.

## 2015-07-21 ENCOUNTER — Ambulatory Visit: Payer: Commercial Managed Care - PPO | Admitting: Family Medicine

## 2015-07-22 ENCOUNTER — Ambulatory Visit (INDEPENDENT_AMBULATORY_CARE_PROVIDER_SITE_OTHER): Payer: Commercial Managed Care - PPO | Admitting: Family Medicine

## 2015-07-22 DIAGNOSIS — R69 Illness, unspecified: Secondary | ICD-10-CM

## 2015-07-22 NOTE — Progress Notes (Signed)
  Late vancel

## 2015-08-02 ENCOUNTER — Encounter: Payer: Self-pay | Admitting: Family Medicine

## 2015-08-02 ENCOUNTER — Ambulatory Visit (INDEPENDENT_AMBULATORY_CARE_PROVIDER_SITE_OTHER): Payer: Commercial Managed Care - PPO | Admitting: Family Medicine

## 2015-08-02 VITALS — BP 118/70 | HR 88 | Temp 98.2°F | Ht 64.0 in | Wt 156.5 lb

## 2015-08-02 DIAGNOSIS — E785 Hyperlipidemia, unspecified: Secondary | ICD-10-CM | POA: Diagnosis not present

## 2015-08-02 DIAGNOSIS — E039 Hypothyroidism, unspecified: Secondary | ICD-10-CM

## 2015-08-02 DIAGNOSIS — R5383 Other fatigue: Secondary | ICD-10-CM

## 2015-08-02 DIAGNOSIS — F3342 Major depressive disorder, recurrent, in full remission: Secondary | ICD-10-CM | POA: Diagnosis not present

## 2015-08-02 LAB — TSH: TSH: 1.62 u[IU]/mL (ref 0.35–4.50)

## 2015-08-02 LAB — LIPID PANEL
CHOL/HDL RATIO: 5
CHOLESTEROL: 229 mg/dL — AB (ref 0–200)
HDL: 48.2 mg/dL (ref >39.00)
NonHDL: 180.75
TRIGLYCERIDES: 224 mg/dL — AB (ref 0.0–149.0)
VLDL: 44.8 mg/dL — ABNORMAL HIGH (ref 0.0–40.0)

## 2015-08-02 LAB — BASIC METABOLIC PANEL
BUN: 23 mg/dL (ref 6–23)
CALCIUM: 9.5 mg/dL (ref 8.4–10.5)
CO2: 26 meq/L (ref 19–32)
CREATININE: 0.76 mg/dL (ref 0.40–1.20)
Chloride: 102 mEq/L (ref 96–112)
GFR: 90.12 mL/min (ref >60.00)
GLUCOSE: 84 mg/dL (ref 70–99)
Potassium: 4 mEq/L (ref 3.5–5.1)
Sodium: 138 mEq/L (ref 135–145)

## 2015-08-02 LAB — CBC
HEMATOCRIT: 41 % (ref 36.0–46.0)
HEMOGLOBIN: 14 g/dL (ref 12.0–15.0)
MCHC: 34.3 g/dL (ref 30.0–36.0)
MCV: 92.2 fl (ref 78.0–100.0)
Platelets: 288 10*3/uL (ref 150.0–400.0)
RBC: 4.45 Mil/uL (ref 3.87–5.11)
RDW: 13.7 % (ref 11.5–15.5)
WBC: 5.8 10*3/uL (ref 4.0–10.5)

## 2015-08-02 LAB — LDL CHOLESTEROL, DIRECT: Direct LDL: 141 mg/dL

## 2015-08-02 LAB — HEMOGLOBIN A1C: Hgb A1c MFr Bld: 4.7 % (ref 4.6–6.5)

## 2015-08-02 NOTE — Progress Notes (Signed)
Pre visit review using our clinic review tool, if applicable. No additional management support is needed unless otherwise documented below in the visit note. 

## 2015-08-02 NOTE — Progress Notes (Signed)
HPI:  Dawn Moss is a pleasant 40 year old with a past medical history significant for depression, HLD, ADD, seasonal allergies, hypothyroidism and migraines here for a follow-up visit. We have not seen her in over 1 year and she recently had her third child in November 2016. She reports that overall she is feeling well. She reports a healthy diet. She has increased her exercise and now is doing aerobic exercise and does feel tired when exercising. She has not had these recent lab work. She continues to alternate her thyroid dosing between 25 and 50 g. Denies chest pain, wheezing, cough, significant shortness of breath. She has had mild constipation at times. Reports her mood has been good and she actually may consider weaning off the Celexa at some point.   ROS: See pertinent positives and negatives per HPI.  Past Medical History  Diagnosis Date  . Depression   . Hypothyroidism   . Family history of malignant neoplasm of breast   . Infertility, female   . ZOXWRUEA(540.9)     Past Surgical History  Procedure Laterality Date  . Tonsillectomy      1997  . Knee surgery    . Cesarean section N/A 12/18/2013    Procedure: Primary CESAREAN SECTION;  Surgeon: Lenoard Aden, MD;  Location: [facility];  Service: Obstetrics;  Laterality: N/A;    Family History  Problem Relation Age of Onset  . Crohn's disease Mother   . Depression Mother   . Hypertension Father   . Cancer Maternal Grandmother     breast cancer  . Heart disease Maternal Grandfather   . Diabetes Paternal Grandfather   . Heart disease Paternal Grandfather     Social History   Social History  . Marital Status: Married    Spouse Name: N/A  . Number of Children: N/A  . Years of Education: N/A   Social History Main Topics  . Smoking status: Former Smoker    Quit date: 10/07/2010  . Smokeless tobacco: Never Used  . Alcohol Use: Yes     Comment: glass or 2 of wine per week  . Drug Use: No  . Sexual Activity: Yes     Birth Control/ Protection: Other-see comments   Other Topics Concern  . 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  citalopram (CELEXA) 20 MG tablet, Take 1 tablet by mouth every day, Disp: 30 tablet, Rfl: 3 .  levothyroxine (SYNTHROID, LEVOTHROID) 25 MCG tablet, Take 1 daily by mouth qod 2 tabs by mouth qod, Disp: 45 tablet, Rfl: 1 .  levothyroxine (SYNTHROID, LEVOTHROID) 50 MCG tablet, Take 50 mcg by mouth every other day. Alternates wit5 , Disp: , Rfl:  .  loratadine (CLARITIN) 10 MG tablet, Take 10 mg by mouth daily., Disp: , Rfl:   EXAM:  Filed Vitals:   08/02/15 1105  BP: 118/70  Pulse: 88  Temp: 98.2 F (36.8 C)    Body mass index is 26.85 kg/(m^2).  GENERAL: vitals reviewed and listed above, alert, oriented, appears well hydrated and in no acute distress  HEENT:  atraumatic, conjunttiva clear, no obvious abnormalities on inspection of external nose and ears  NECK: no obvious masses on inspection  LUNGS: clear to auscultation bilaterally, no wheezes, rales or rhonchi, good air movement  CV: HRRR, no peripheral edema  MS: moves all extremities without noticeable abnormality  PSYCH: pleasant and cooperative, no obvious depression or anxiety  ASSESSMENT AND PLAN:  Discussed the following assessment and plan:  Hypothyroidism, unspecified hypothyroidism type - Plan: TSH  Hyperlipemia - Plan: Lipid Panel  Recurrent major depressive disorder, in full remission (HCC)  Other fatigue - Plan: Hemoglobin A1c, CBC (no diff), Basic metabolic panel  -needs FASTING lab work - opted to do non-fasting today  -lifestyle recs -follow up in 3 months -Patient  advised to return or notify a doctor immediately if symptoms worsen or persist or new concerns arise.  Patient Instructions  Before you leave: -Schedule follow-up in 3 months -Labs  -We have ordered labs or studies at this visit. It can take up to 1-2 weeks for results and processing. We will contact you with instructions IF your results are abnormal. Normal results will be released to [facility]. If you have not heard from us or can not find your results in [facility] in 2 weeks please contact our office.  We recommend the following healthy lifestyle measures: - eat a healthy whole foods diet consisting of regular small meals composed of vegetables, fruits, beans, nuts, seeds, healthy meats such as white chicken and fish and whole grains.  - avoid sweets, white starchy foods, fried foods, fast food, processed foods, sodas, red meet and other fattening foods.  - get a least 150-300 minutes of aerobic exercise per week.            Kriste BasqueKIM, HANNAH R.

## 2015-08-02 NOTE — Patient Instructions (Signed)
Before you leave: -Schedule follow-up in 3 months -Labs  -We have ordered labs or studies at this visit. It can take up to 1-2 weeks for results and processing. We will contact you with instructions IF your results are abnormal. Normal results will be released to [facility]. If you have not heard from [place] or can not find your results in [facility] in 2 weeks please contact our office.  We recommend the following healthy lifestyle measures: - eat a healthy whole foods diet consisting of regular small meals composed of vegetables, fruits, beans, nuts, seeds, healthy meats such as white chicken and fish and whole grains.  - avoid sweets, white starchy foods, fried foods, fast food, processed foods, sodas, red meet and other fattening foods.  - get a least 150-300 minutes of aerobic exercise per week.

## 2015-08-05 ENCOUNTER — Telehealth: Payer: Self-pay | Admitting: *Deleted

## 2015-08-05 MED ORDER — LEVOTHYROXINE SODIUM 25 MCG PO TABS: ORAL_TABLET | ORAL | Status: DC

## 2015-08-05 NOTE — Telephone Encounter (Signed)
Patient left a message on my voicemail stating she forgot to ask about her thyroid test results and questioned if she needed to change the dose of her medication? Stated to leave a detailed message with this information at her number if she does not answer.

## 2015-08-05 NOTE — Telephone Encounter (Signed)
I called the pt and informed her of the message below

## 2015-08-05 NOTE — Telephone Encounter (Signed)
Please see result note. Thyroid normal. Cont current dose.

## 2015-08-05 NOTE — Addendum Note (Signed)
Addended by: Johnella MoloneyFUNDERBURK, JO A on: 08/05/2015 08:45 AM   Modules accepted: Orders

## 2015-09-15 ENCOUNTER — Other Ambulatory Visit (INDEPENDENT_AMBULATORY_CARE_PROVIDER_SITE_OTHER): Payer: Commercial Managed Care - PPO

## 2015-09-15 DIAGNOSIS — E785 Hyperlipidemia, unspecified: Secondary | ICD-10-CM

## 2015-09-15 LAB — LIPID PANEL
CHOLESTEROL: 229 mg/dL — AB (ref 0–200)
HDL: 49.8 mg/dL (ref >39.00)
LDL CALC: 162 mg/dL — AB (ref 0–99)
NonHDL: 178.99
TRIGLYCERIDES: 86 mg/dL (ref 0.0–149.0)
Total CHOL/HDL Ratio: 5
VLDL: 17.2 mg/dL (ref 0.0–40.0)

## 2015-09-20 ENCOUNTER — Ambulatory Visit (INDEPENDENT_AMBULATORY_CARE_PROVIDER_SITE_OTHER): Payer: Commercial Managed Care - PPO | Admitting: Family Medicine

## 2015-09-20 ENCOUNTER — Encounter: Payer: Self-pay | Admitting: Family Medicine

## 2015-09-20 VITALS — BP 120/84 | HR 70 | Temp 98.4°F | Wt 158.9 lb

## 2015-09-20 DIAGNOSIS — E785 Hyperlipidemia, unspecified: Secondary | ICD-10-CM | POA: Diagnosis not present

## 2015-09-20 LAB — HEPATIC FUNCTION PANEL
ALK PHOS: 79 U/L (ref 39–117)
ALT: 17 U/L (ref 0–35)
AST: 22 U/L (ref 0–37)
Albumin: 4.7 g/dL (ref 3.5–5.2)
BILIRUBIN DIRECT: 0.1 mg/dL (ref 0.0–0.3)
BILIRUBIN TOTAL: 0.4 mg/dL (ref 0.2–1.2)
Total Protein: 6.9 g/dL (ref 6.0–8.3)

## 2015-09-20 MED ORDER — PRAVASTATIN SODIUM 20 MG PO TABS: 20.0000 mg | ORAL_TABLET | Freq: Every day | ORAL | Status: DC

## 2015-09-20 NOTE — Patient Instructions (Addendum)
Before you leave: -labs to check liver function -Schedule lab appointment in 4 months to recheck her cholesterol Schedule follow-up in 6 months  Start the pravastatin 20 mg once daily.  Continue a healthy diet and regular aerobic exercise.  We recommend the following healthy lifestyle measures: - eat a healthy whole foods diet consisting of regular small meals composed of vegetables, fruits, beans, nuts, seeds, healthy meats such as white chicken and fish and whole grains.  - avoid sweets, white starchy foods, fried foods, fast food, processed foods, sodas, red meet and other fattening foods.  - get a least 150-300 minutes of aerobic exercise per week.

## 2015-09-20 NOTE — Progress Notes (Signed)
Pre visit review using our clinic review tool, if applicable. No additional management support is needed unless otherwise documented below in the visit note. 

## 2015-09-20 NOTE — Progress Notes (Signed)
HPI:  Follow up:  Hyperlipidemia: -significantly elevated, LDL >160, ratio 5 -diet and exercise: very healthy diet, Mediterranean, fish on a regular basis, regular aerobic exercise -family hx: Found out from her mother that both her mother and father have elevated cholesterol despite a healthy lifestyle, maternal grandmother and maternal grandfather with triple bypass in their 3450s -thyroid controlled with tSH < 2 -No [place] press coffee  Large breasts. She is considering breast reduction. Hurts where bra sits. She is seeing Engineer, petroleumplastic surgeon.  ROS: See pertinent positives and negatives per HPI.  Past Medical History  Diagnosis Date  . Depression   . Hypothyroidism   . Family history of malignant neoplasm of breast   . Infertility, female   . WUJWJXBJ(478.2Headache(784.0)     Past Surgical History  Procedure Laterality Date  . Tonsillectomy      1997  . Knee surgery    . Cesarean section N/A 12/18/2013    Procedure: Primary CESAREAN SECTION;  Surgeon: Lenoard Adenichard J Taavon, MD;  Location: [facility];  Service: Obstetrics;  Laterality: N/A;    Family History  Problem Relation Age of Onset  . Crohn's disease Mother   . Depression Mother   . Hypertension Father   . Cancer Maternal Grandmother     breast cancer  . Heart disease Maternal Grandfather   . Diabetes Paternal Grandfather   . Heart disease Paternal Grandfather   . Hyperlipidemia Mother   . Hyperlipidemia Father     Social History   Social History  . Marital Status: Married    Spouse Name: N/A  . Number of Children: N/A  . Years of Education: N/A   Social History Main Topics  . Smoking status: Former Smoker    Quit date: 10/07/2010  . Smokeless tobacco: Never Used  . Alcohol Use: Yes     Comment: glass or 2 of wine per week  . Drug Use: No  . Sexual Activity: Yes    Birth Control/ Protection: Other-see comments   Other Topics Concern  . 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  citalopram (CELEXA) 20 MG tablet, Take 1 tablet by mouth every day, Disp: 30 tablet, Rfl: 3 .  levothyroxine (SYNTHROID, LEVOTHROID) 25 MCG tablet, Take 1 daily by mouth qod 2 tabs by mouth qod, Disp: 45 tablet, Rfl: 5 .  loratadine (CLARITIN) 10 MG tablet, Take 10 mg by mouth daily., Disp: , Rfl:  .  pravastatin (PRAVACHOL) 20 MG tablet, Take 1 tablet (20 mg total) by mouth daily., Disp: 90 tablet, Rfl: 3  EXAM:  Filed Vitals:   09/20/15 1037  BP: 120/84  Pulse: 70  Temp: 98.4 F (36.9 C)    Body mass index is 27.26 kg/(m^2).  GENERAL: vitals reviewed and listed above, alert, oriented, appears well hydrated and in no acute distress  HEENT: atraumatic, conjunttiva clear, no obvious abnormalities on inspection of external nose and ears  NECK: no obvious masses on inspection  LUNGS: clear to auscultation bilaterally, no wheezes, rales or rhonchi, good air movement  CV: HRRR, no peripheral  edema  MS: moves all extremities without noticeable abnormality  PSYCH: pleasant and cooperative, no obvious depression or anxiety  ASSESSMENT AND PLAN:  Discussed the following assessment and plan:  Hyperlipemia - Plan: Hepatic Function Panel, Lipid Panel  -risk benefits of treatment discussed -She is eating very healthy, eats fish often, eats a lot of minute training. And is exercising -She has a significant family history in her mother was on statins from age 66 per her report -She is interested in taking a low-dose statin after discussion of risks - not lactating -recheck in 4 months -Patient advised to return or notify a doctor immediately if symptoms worsen or persist or new concerns arise.  Patient  Instructions  Before you leave: -labs to check liver function -Schedule lab appointment in 4 months to recheck her cholesterol Schedule follow-up in 6 months  Start the pravastatin 20 mg once daily.  Continue a healthy diet and regular aerobic exercise.  We recommend the following healthy lifestyle measures: - eat a healthy whole foods diet consisting of regular small meals composed of vegetables, fruits, beans, nuts, seeds, healthy meats such as white chicken and fish and whole grains.  - avoid sweets, white starchy foods, fried foods, fast food, processed foods, sodas, red meet and other fattening foods.  - get a least 150-300 minutes of aerobic exercise per week.         Kriste Basque R.

## 2015-09-29 ENCOUNTER — Telehealth: Payer: Self-pay | Admitting: Family Medicine

## 2015-09-29 NOTE — Telephone Encounter (Signed)
Pt states Dr Selena BattenKim changed her rx for citalopram (CELEXA) 25 MG tablet to every other day, and 50 mg in between. Pt states she saw Dr Selena BattenKim 4/25 and states Dr Selena BattenKim realized this needed to be changed but it has not been done.  Pt states while she was pregnant, her OBGYN prescribed this med for her. But now she sees Dr Selena BattenKim for all her meds. Pharmacy will not refill because they say it is too early.  Pt would like to know if this can be corrected so she can get her celexa.  [facility]

## 2015-09-29 NOTE — Telephone Encounter (Signed)
I called the pt and informed her of the message below and she stated she was referring to Levothyroid not Celexa.  I called the pharmacy and spoke with Sharlet SalinaBenjamin and he stated the pt picked up #90 on 3/6 and should have #30 left and I called the pt and informed her of this and asked that she call Sharlet SalinaBenjamin.  After reviewing the chart Dr Selena BattenKim asked that I call the pharmacy to be sure they have the correct instructions as she would need a refill at this time if taken as instructed.  I called [facility] back and spoke with Amy and she stated the Rx was filled on 3/6 to take one a day and they will override this and give the pt a 3 month supply of #135 and this should be ready in an hour.  I called the pt and informed her of this and she stated she just got off of the phone with the pharmacy and they filled this under a old Rx that was given by Dr Billy Coastaavon to take once a day and they are correcting this.

## 2015-09-29 NOTE — Telephone Encounter (Signed)
Left a message for the pt to return my call.  

## 2015-09-29 NOTE — Telephone Encounter (Signed)
I think she is talking about her thyroid medication? Levothyroxine? She takes 25 alternating with 50 g. It was sent in this way. Can you please check with patient and pharmacy to figure out what her concern is?

## 2015-10-28 ENCOUNTER — Telehealth: Payer: Self-pay | Admitting: Family Medicine

## 2015-10-28 NOTE — Telephone Encounter (Signed)
Has she changed her workout routine? If not, would advise trying every other day dosing to see if this helps.

## 2015-10-28 NOTE — Telephone Encounter (Signed)
Pt stated that since she has been taking pravastatin she has been experiencing a lot of muscle soreness after exercising.  Pt would like to know what she can do about this.  Pt would like a call back today if you are not able to speak with her please leave a detail message on her machine.

## 2015-10-28 NOTE — Telephone Encounter (Signed)
I called the pt and left a detailed message at her cell number with the information below. 

## 2015-12-23 DIAGNOSIS — F900 Attention-deficit hyperactivity disorder, predominantly inattentive type: Secondary | ICD-10-CM | POA: Diagnosis not present

## 2016-01-19 DIAGNOSIS — F902 Attention-deficit hyperactivity disorder, combined type: Secondary | ICD-10-CM | POA: Diagnosis not present

## 2016-01-26 DIAGNOSIS — N6012 Diffuse cystic mastopathy of left breast: Secondary | ICD-10-CM | POA: Diagnosis not present

## 2016-02-08 DIAGNOSIS — E039 Hypothyroidism, unspecified: Secondary | ICD-10-CM | POA: Diagnosis not present

## 2016-02-08 DIAGNOSIS — F902 Attention-deficit hyperactivity disorder, combined type: Secondary | ICD-10-CM | POA: Diagnosis not present

## 2016-02-08 DIAGNOSIS — E7800 Pure hypercholesterolemia, unspecified: Secondary | ICD-10-CM | POA: Diagnosis not present

## 2016-02-08 DIAGNOSIS — F900 Attention-deficit hyperactivity disorder, predominantly inattentive type: Secondary | ICD-10-CM | POA: Diagnosis not present

## 2016-02-08 DIAGNOSIS — Z79899 Other long term (current) drug therapy: Secondary | ICD-10-CM | POA: Diagnosis not present

## 2016-02-13 DIAGNOSIS — L239 Allergic contact dermatitis, unspecified cause: Secondary | ICD-10-CM | POA: Diagnosis not present

## 2016-02-17 DIAGNOSIS — L235 Allergic contact dermatitis due to other chemical products: Secondary | ICD-10-CM | POA: Diagnosis not present

## 2016-02-17 DIAGNOSIS — L508 Other urticaria: Secondary | ICD-10-CM | POA: Diagnosis not present

## 2016-02-17 DIAGNOSIS — L259 Unspecified contact dermatitis, unspecified cause: Secondary | ICD-10-CM | POA: Diagnosis not present

## 2016-04-06 DIAGNOSIS — E7800 Pure hypercholesterolemia, unspecified: Secondary | ICD-10-CM | POA: Diagnosis not present

## 2016-04-06 DIAGNOSIS — Z79899 Other long term (current) drug therapy: Secondary | ICD-10-CM | POA: Diagnosis not present

## 2016-04-06 DIAGNOSIS — E039 Hypothyroidism, unspecified: Secondary | ICD-10-CM | POA: Diagnosis not present

## 2016-05-10 DIAGNOSIS — J069 Acute upper respiratory infection, unspecified: Secondary | ICD-10-CM | POA: Diagnosis not present

## 2016-05-11 DIAGNOSIS — B349 Viral infection, unspecified: Secondary | ICD-10-CM | POA: Diagnosis not present

## 2016-05-25 DIAGNOSIS — J329 Chronic sinusitis, unspecified: Secondary | ICD-10-CM | POA: Diagnosis not present

## 2016-09-21 DIAGNOSIS — E7800 Pure hypercholesterolemia, unspecified: Secondary | ICD-10-CM | POA: Diagnosis not present

## 2016-09-21 DIAGNOSIS — R454 Irritability and anger: Secondary | ICD-10-CM | POA: Diagnosis not present

## 2016-09-21 DIAGNOSIS — E039 Hypothyroidism, unspecified: Secondary | ICD-10-CM | POA: Diagnosis not present

## 2016-09-21 DIAGNOSIS — F900 Attention-deficit hyperactivity disorder, predominantly inattentive type: Secondary | ICD-10-CM | POA: Diagnosis not present

## 2016-10-05 DIAGNOSIS — J189 Pneumonia, unspecified organism: Secondary | ICD-10-CM | POA: Diagnosis not present

## 2016-10-12 ENCOUNTER — Other Ambulatory Visit: Payer: Self-pay | Admitting: Family Medicine

## 2016-10-12 ENCOUNTER — Ambulatory Visit
Admission: RE | Admit: 2016-10-12 | Discharge: 2016-10-12 | Disposition: A | Discharge status: Home / Self Care | Payer: BLUE CROSS/BLUE SHIELD | Source: Ambulatory Visit | Attending: Family Medicine | Admitting: Family Medicine

## 2016-10-12 DIAGNOSIS — R05 Cough: Secondary | ICD-10-CM

## 2016-10-12 DIAGNOSIS — R0600 Dyspnea, unspecified: Secondary | ICD-10-CM

## 2016-10-12 DIAGNOSIS — R059 Cough, unspecified: Secondary | ICD-10-CM

## 2016-10-12 DIAGNOSIS — R0602 Shortness of breath: Secondary | ICD-10-CM | POA: Diagnosis not present

## 2016-10-16 DIAGNOSIS — Z01419 Encounter for gynecological examination (general) (routine) without abnormal findings: Secondary | ICD-10-CM | POA: Diagnosis not present

## 2016-10-16 DIAGNOSIS — B373 Candidiasis of vulva and vagina: Secondary | ICD-10-CM | POA: Diagnosis not present

## 2016-10-16 DIAGNOSIS — Z6825 Body mass index (BMI) 25.0-25.9, adult: Secondary | ICD-10-CM | POA: Diagnosis not present

## 2016-10-16 DIAGNOSIS — N898 Other specified noninflammatory disorders of vagina: Secondary | ICD-10-CM | POA: Diagnosis not present

## 2016-10-30 DIAGNOSIS — Z1231 Encounter for screening mammogram for malignant neoplasm of breast: Secondary | ICD-10-CM | POA: Diagnosis not present

## 2016-11-26 DIAGNOSIS — E7800 Pure hypercholesterolemia, unspecified: Secondary | ICD-10-CM | POA: Diagnosis not present

## 2017-03-27 DIAGNOSIS — E039 Hypothyroidism, unspecified: Secondary | ICD-10-CM | POA: Diagnosis not present

## 2017-03-27 DIAGNOSIS — E7800 Pure hypercholesterolemia, unspecified: Secondary | ICD-10-CM | POA: Diagnosis not present

## 2017-03-27 DIAGNOSIS — Z23 Encounter for immunization: Secondary | ICD-10-CM | POA: Diagnosis not present

## 2017-03-27 DIAGNOSIS — R454 Irritability and anger: Secondary | ICD-10-CM | POA: Diagnosis not present

## 2017-03-27 DIAGNOSIS — F900 Attention-deficit hyperactivity disorder, predominantly inattentive type: Secondary | ICD-10-CM | POA: Diagnosis not present

## 2017-04-09 DIAGNOSIS — J019 Acute sinusitis, unspecified: Secondary | ICD-10-CM | POA: Diagnosis not present

## 2017-07-01 DIAGNOSIS — Z419 Encounter for procedure for purposes other than remedying health state, unspecified: Secondary | ICD-10-CM | POA: Diagnosis not present

## 2017-07-10 DIAGNOSIS — N926 Irregular menstruation, unspecified: Secondary | ICD-10-CM | POA: Diagnosis not present

## 2017-07-19 DIAGNOSIS — R454 Irritability and anger: Secondary | ICD-10-CM | POA: Diagnosis not present

## 2017-08-01 DIAGNOSIS — S7002XA Contusion of left hip, initial encounter: Secondary | ICD-10-CM | POA: Diagnosis not present

## 2017-08-01 DIAGNOSIS — M25522 Pain in left elbow: Secondary | ICD-10-CM | POA: Diagnosis not present

## 2017-08-01 DIAGNOSIS — S5002XA Contusion of left elbow, initial encounter: Secondary | ICD-10-CM | POA: Diagnosis not present

## 2017-10-02 DIAGNOSIS — Z23 Encounter for immunization: Secondary | ICD-10-CM | POA: Diagnosis not present

## 2017-10-02 DIAGNOSIS — E039 Hypothyroidism, unspecified: Secondary | ICD-10-CM | POA: Diagnosis not present

## 2017-10-02 DIAGNOSIS — Z79899 Other long term (current) drug therapy: Secondary | ICD-10-CM | POA: Diagnosis not present

## 2017-10-02 DIAGNOSIS — F900 Attention-deficit hyperactivity disorder, predominantly inattentive type: Secondary | ICD-10-CM | POA: Diagnosis not present

## 2017-10-02 DIAGNOSIS — R454 Irritability and anger: Secondary | ICD-10-CM | POA: Diagnosis not present

## 2017-10-02 DIAGNOSIS — E7800 Pure hypercholesterolemia, unspecified: Secondary | ICD-10-CM | POA: Diagnosis not present

## 2017-11-21 DIAGNOSIS — F900 Attention-deficit hyperactivity disorder, predominantly inattentive type: Secondary | ICD-10-CM | POA: Diagnosis not present

## 2018-02-04 DIAGNOSIS — Z01419 Encounter for gynecological examination (general) (routine) without abnormal findings: Secondary | ICD-10-CM | POA: Diagnosis not present

## 2018-02-04 DIAGNOSIS — Z1151 Encounter for screening for human papillomavirus (HPV): Secondary | ICD-10-CM | POA: Diagnosis not present

## 2018-02-04 DIAGNOSIS — Z1231 Encounter for screening mammogram for malignant neoplasm of breast: Secondary | ICD-10-CM | POA: Diagnosis not present

## 2018-02-04 DIAGNOSIS — Z6825 Body mass index (BMI) 25.0-25.9, adult: Secondary | ICD-10-CM | POA: Diagnosis not present

## 2018-03-05 DIAGNOSIS — L259 Unspecified contact dermatitis, unspecified cause: Secondary | ICD-10-CM | POA: Diagnosis not present

## 2018-03-05 DIAGNOSIS — Z23 Encounter for immunization: Secondary | ICD-10-CM | POA: Diagnosis not present

## 2018-04-01 IMAGING — DX DG CHEST 2V
2 series · 2 of 2 positions shown · non-contrast
Comparison: None.

CLINICAL DATA: Cough, shortness of breath.

EXAM:
CHEST  2 VIEW

[dg chest 2 view (1 of 2)]
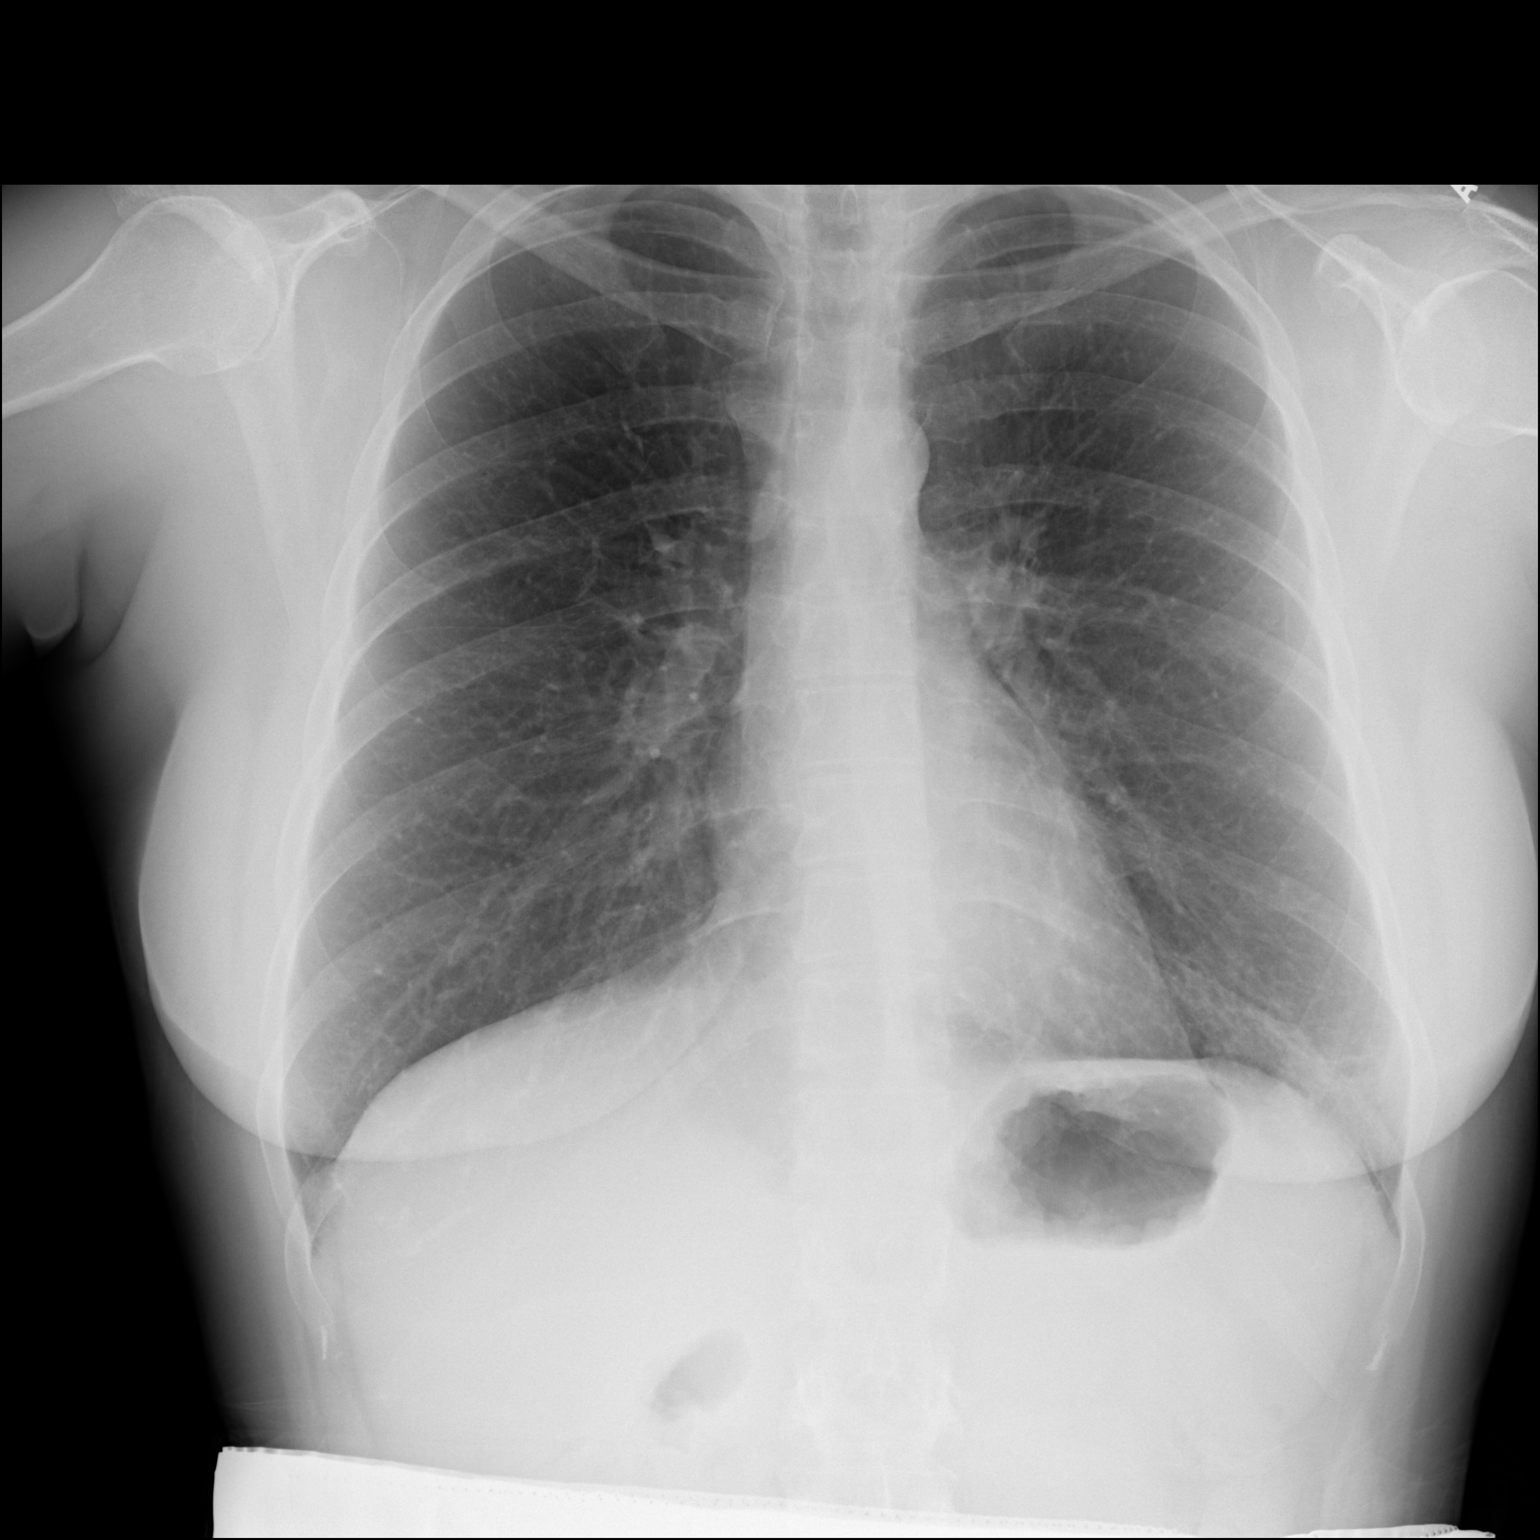

[dg chest 2 view (2 of 2)]
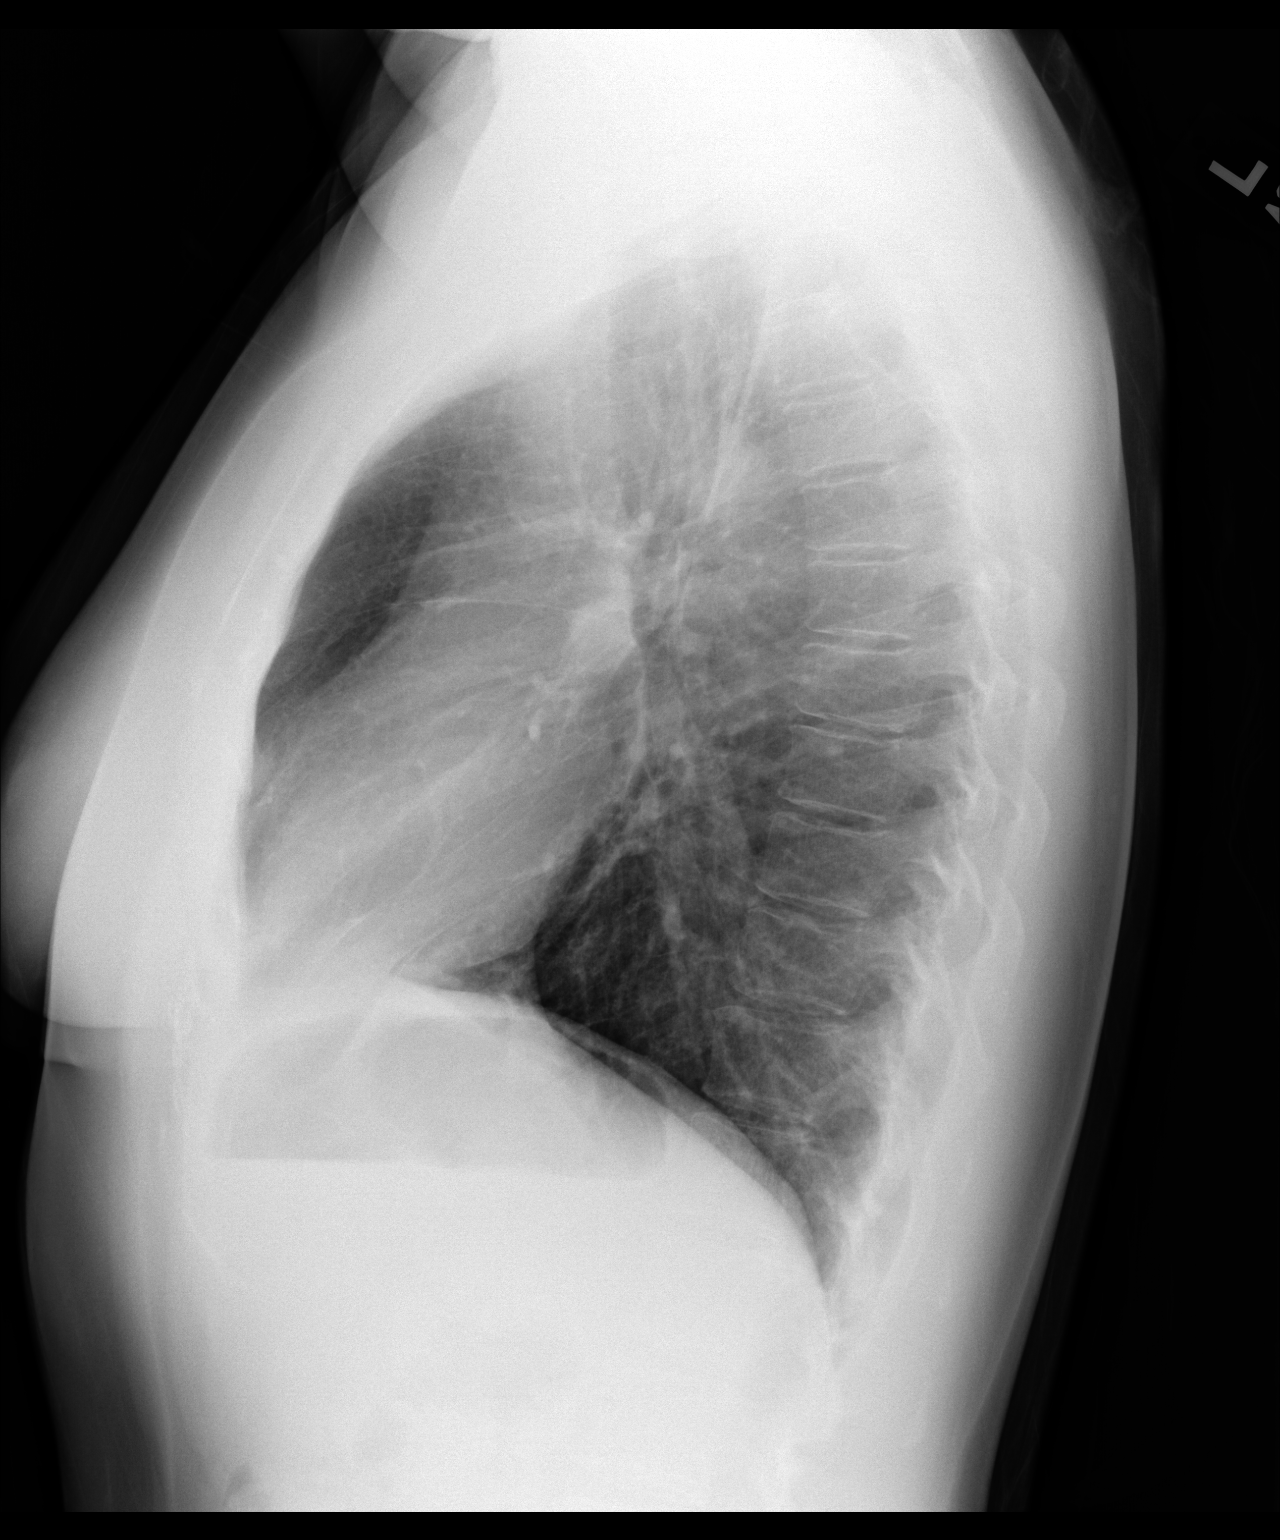

[2 of 2 positions shown; findings below may reference images not displayed]

FINDINGS: The heart size and mediastinal contours are within normal limits.
Both lungs are clear. No pneumothorax or pleural effusion is noted.
The visualized skeletal structures are unremarkable.
IMPRESSION: No active cardiopulmonary disease.

## 2018-04-22 DIAGNOSIS — E7800 Pure hypercholesterolemia, unspecified: Secondary | ICD-10-CM | POA: Diagnosis not present

## 2018-04-22 DIAGNOSIS — R454 Irritability and anger: Secondary | ICD-10-CM | POA: Diagnosis not present

## 2018-04-22 DIAGNOSIS — Z79899 Other long term (current) drug therapy: Secondary | ICD-10-CM | POA: Diagnosis not present

## 2018-04-22 DIAGNOSIS — E039 Hypothyroidism, unspecified: Secondary | ICD-10-CM | POA: Diagnosis not present

## 2018-04-22 DIAGNOSIS — F900 Attention-deficit hyperactivity disorder, predominantly inattentive type: Secondary | ICD-10-CM | POA: Diagnosis not present

## 2018-05-08 DIAGNOSIS — J069 Acute upper respiratory infection, unspecified: Secondary | ICD-10-CM | POA: Diagnosis not present

## 2018-10-21 DIAGNOSIS — R454 Irritability and anger: Secondary | ICD-10-CM | POA: Diagnosis not present

## 2018-10-21 DIAGNOSIS — E7800 Pure hypercholesterolemia, unspecified: Secondary | ICD-10-CM | POA: Diagnosis not present

## 2018-10-21 DIAGNOSIS — F900 Attention-deficit hyperactivity disorder, predominantly inattentive type: Secondary | ICD-10-CM | POA: Diagnosis not present

## 2018-10-21 DIAGNOSIS — E039 Hypothyroidism, unspecified: Secondary | ICD-10-CM | POA: Diagnosis not present

## 2019-01-08 ENCOUNTER — Other Ambulatory Visit: Payer: Self-pay

## 2019-01-08 DIAGNOSIS — Z20822 Contact with and (suspected) exposure to covid-19: Secondary | ICD-10-CM

## 2019-01-10 LAB — NOVEL CORONAVIRUS, NAA: SARS-CoV-2, NAA: NOT DETECTED

## 2019-01-28 DIAGNOSIS — F900 Attention-deficit hyperactivity disorder, predominantly inattentive type: Secondary | ICD-10-CM | POA: Diagnosis not present

## 2019-01-28 DIAGNOSIS — G43909 Migraine, unspecified, not intractable, without status migrainosus: Secondary | ICD-10-CM | POA: Diagnosis not present

## 2019-01-28 DIAGNOSIS — N943 Premenstrual tension syndrome: Secondary | ICD-10-CM | POA: Diagnosis not present

## 2019-03-05 DIAGNOSIS — Z1231 Encounter for screening mammogram for malignant neoplasm of breast: Secondary | ICD-10-CM | POA: Diagnosis not present

## 2019-03-05 DIAGNOSIS — Z124 Encounter for screening for malignant neoplasm of cervix: Secondary | ICD-10-CM | POA: Diagnosis not present

## 2019-03-05 DIAGNOSIS — Z01419 Encounter for gynecological examination (general) (routine) without abnormal findings: Secondary | ICD-10-CM | POA: Diagnosis not present

## 2019-03-05 DIAGNOSIS — Z6824 Body mass index (BMI) 24.0-24.9, adult: Secondary | ICD-10-CM | POA: Diagnosis not present

## 2019-05-04 DIAGNOSIS — R454 Irritability and anger: Secondary | ICD-10-CM | POA: Diagnosis not present

## 2019-05-04 DIAGNOSIS — E039 Hypothyroidism, unspecified: Secondary | ICD-10-CM | POA: Diagnosis not present

## 2019-05-04 DIAGNOSIS — E7800 Pure hypercholesterolemia, unspecified: Secondary | ICD-10-CM | POA: Diagnosis not present

## 2019-05-04 DIAGNOSIS — F900 Attention-deficit hyperactivity disorder, predominantly inattentive type: Secondary | ICD-10-CM | POA: Diagnosis not present

## 2019-05-12 DIAGNOSIS — E7800 Pure hypercholesterolemia, unspecified: Secondary | ICD-10-CM | POA: Diagnosis not present

## 2019-05-12 DIAGNOSIS — Z79899 Other long term (current) drug therapy: Secondary | ICD-10-CM | POA: Diagnosis not present

## 2019-05-12 DIAGNOSIS — E039 Hypothyroidism, unspecified: Secondary | ICD-10-CM | POA: Diagnosis not present

## 2019-05-28 ENCOUNTER — Other Ambulatory Visit: Payer: Self-pay | Admitting: Family Medicine

## 2019-05-28 DIAGNOSIS — R112 Nausea with vomiting, unspecified: Secondary | ICD-10-CM | POA: Diagnosis not present

## 2019-05-28 DIAGNOSIS — R109 Unspecified abdominal pain: Secondary | ICD-10-CM

## 2019-05-28 DIAGNOSIS — R5383 Other fatigue: Secondary | ICD-10-CM | POA: Diagnosis not present

## 2019-05-28 DIAGNOSIS — R110 Nausea: Secondary | ICD-10-CM

## 2019-06-09 ENCOUNTER — Ambulatory Visit
Admission: RE | Admit: 2019-06-09 | Discharge: 2019-06-09 | Disposition: A | Discharge status: Home / Self Care | Payer: BLUE CROSS/BLUE SHIELD | Source: Ambulatory Visit | Attending: Family Medicine | Admitting: Family Medicine

## 2019-06-09 DIAGNOSIS — R109 Unspecified abdominal pain: Secondary | ICD-10-CM

## 2019-06-09 DIAGNOSIS — R110 Nausea: Secondary | ICD-10-CM

## 2019-11-27 ENCOUNTER — Encounter: Payer: Self-pay | Admitting: Neurology

## 2020-01-21 NOTE — Progress Notes (Signed)
NEUROLOGY CONSULTATION NOTE  [name] MRN: [id number] DOB: 10/24/1975  Referring provider: Blair Heys, MD Primary care provider: Blair Heys, MD  Reason for consult:  migraine  HISTORY OF PRESENT ILLNESS: Dawn Moss. Dawn Moss is a 44 year old right-handed female with ADHD and hypothyroidism who presents for migraine.  History supplemented by referring provider's note.  Onset:  44 years old, after having her first child.  Started once a month but gradually progressed.  They are severe left sided face and head pressure.  Associated with photophobia, phonophobia, dizziness, nausea but no visual disturbance, numbness or weakness.  They last 1 week (sometimes 2 weeks).  They respond to Maxalt in 30 to 60 minutes but she may have a dull headache for 12 hours and migraine will return.  They occur once a month. It tends to occur within the first 2 weeks after her period.  Triggers include heat, stress, loud noise.  Sleep helps relieve them.     Current NSAIDS:  Aleve Current analgesics:  none Current triptans:  Maxalt 10mg  Current ergotamine:  none Current anti-emetic:  none Current muscle relaxants:  none Current anti-anxiolytic:  none Current sleep aide:  none Current Antihypertensive medications:  none Current Antidepressant medications:  Citalopram 40mg , Vyvanse Current Anticonvulsant medications:  none Current anti-CGRP:  none Current Vitamins/Herbal/Supplements:  none Current Antihistamines/Decongestants:  Claritin, Zyrtec Other therapy:  none Hormone/birth control:  none Other medications:  Dextroamphetamine, levotyroxine  Past NSAIDS:  Ibuprofen, naproxen Past analgesics:  Excedrin, Tylenol Past abortive triptans:  none Past abortive ergotamine:  none Past muscle relaxants:  none Past anti-emetic:  none Past antihypertensive medications:  none Past antidepressant medications:  none Past anticonvulsant medications:  none Past anti-CGRP:  none Past  vitamins/Herbal/Supplements:  CoQ10 Past antihistamines/decongestants:  none Other past therapies:  none  Caffeine:  1 cup of coffee daily.  No soda or tea. Diet:  1 gallon water daily.  Not skips meals.  Grazes during the day and large dinner Exercise:  routine Depression:  Yes but stable with citalopram; Anxiety:  no Other pain:  no Sleep hygiene:  Good. Family history of headache:  Mother (migraines)  11/26/2019 LABS:  TSH 2.58; CMP with Na 139, K 4.4, Cl 104, CO2 30, Ca 9.5, glucose 86, BUN 15, Cr 0.91, GFR 67, t bili 0.8, ALP 78, AST 19, ALT 17   PAST MEDICAL HISTORY: Past Medical History:  Diagnosis Date  . Depression   . Family history of malignant neoplasm of breast   . Headache(784.0)   . Hypothyroidism   . Infertility, female     PAST SURGICAL HISTORY: Past Surgical History:  Procedure Laterality Date  . CESAREAN SECTION N/A 12/18/2013   Procedure: Primary CESAREAN SECTION;  Surgeon: 01/27/2020, MD;  Location: [facility];  Service: Obstetrics;  Laterality: N/A;  . KNEE SURGERY    . TONSILLECTOMY     1997    MEDICATIONS: Current Outpatient Medications on File Prior to Visit  Medication Sig Dispense Refill  . citalopram (CELEXA) 20 MG tablet Take 1 tablet by mouth every day 30 tablet 3  . levothyroxine (SYNTHROID, LEVOTHROID) 25 MCG tablet Take 1 daily by mouth qod 2 tabs by mouth qod 45 tablet 5  . loratadine (CLARITIN) 10 MG tablet Take 10 mg by mouth daily.    . pravastatin (PRAVACHOL) 20 MG tablet Take 1 tablet (20 mg total) by mouth daily. 90 tablet 3   No current facility-administered medications on file prior to visit.  ALLERGIES: Allergies  Allergen Reactions  . Codeine   . Morphine And Related   . Shellfish Allergy Nausea And Vomiting    FAMILY HISTORY: Family History  Problem Relation Age of Onset  . Crohn's disease Mother   . Depression Mother   . Hypertension Father   . Cancer Maternal Grandmother        breast cancer  . Heart disease  Maternal Grandfather   . Diabetes Paternal Grandfather   . Heart disease Paternal Grandfather   . Hyperlipidemia Mother   . Hyperlipidemia Father     SOCIAL HISTORY: Social History   Socioeconomic History  . Marital status: Married    Spouse name: Not on file  . Number of children: Not on file  . Years of education: Not on file  . Highest education level: Not on file  Occupational History  . Not on file  Tobacco Use  . Smoking status: Former Smoker    Quit date: 10/07/2010    Years since quitting: 9.2  . Smokeless tobacco: Never Used  Substance and Sexual Activity  . Alcohol use: Yes    Comment: glass or 2 of wine per week  . Drug use: No  . Sexual activity: Yes    Birth control/protection: Other-see comments  Other Topics Concern  . 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 Difficulty of Paying Living Expenses: Not on file  Food Insecurity:   . Worried About Programme researcher, broadcasting/film/video in the Last Year: Not on file  . Ran Out of Food in the Last Year: Not on file  Transportation Needs:   . Lack of Transportation (Medical): Not on file  . Lack of Transportation (Non-Medical): Not on file  Physical Activity:   . Days of Exercise per Week: Not on file  . Minutes of Exercise per Session: Not on file  Stress:   . Feeling of Stress : Not on file  Social Connections:   . Frequency of Communication with Friends and Family: Not on file  . Frequency of Social Gatherings with Friends and Family: Not on file  . Attends Religious Services: Not on file  . Active Member of Clubs or Organizations: Not on file  . Attends Banker  Meetings: Not on file  . Marital Status: Not on file  Intimate Partner Violence:   . Fear of Current or Ex-Partner: Not on file  . Emotionally Abused: Not on file  . Physically Abused: Not on file  . Sexually Abused: Not on file   PHYSICAL EXAM: Blood pressure (!) 133/94, pulse 83, height 5\' 4"  (1.626 m), weight 154 lb 12.8 oz (70.2 kg), SpO2 98 %, unknown if currently breastfeeding. General: No acute distress.  Patient appears well-groomed.  Head:  Normocephalic/atraumatic Eyes:  fundi examined but not visualized Neck: supple, no paraspinal tenderness, full range of motion Back: No paraspinal tenderness Heart: regular rate and rhythm Lungs: Clear to auscultation bilaterally. Vascular: No carotid bruits. Neurological Exam: Mental status: alert and oriented to person, place, and time, recent  and remote memory intact, fund of knowledge intact, attention and concentration intact, speech fluent and not dysarthric, language intact. Cranial nerves: CN I: not tested CN II: pupils equal, round and reactive to light, visual fields intact CN III, IV, VI:  full range of motion, no nystagmus, no ptosis CN V: facial sensation intact CN VII: upper and lower face symmetric CN VIII: hearing intact CN IX, X: gag intact, uvula midline CN XI: sternocleidomastoid and trapezius muscles intact CN XII: tongue midline Bulk & Tone: normal, no fasciculations. Motor:  5/5 throughout Sensation:  Pinprick and vibration sensation intact. Deep Tendon Reflexes:  2+ throughout, toes downgoing.  Finger to nose testing:  Without dysmetria.  Heel to shin:  Without dysmetria.  Gait:  Normal station and stride.  Able to turn and tandem walk. Romberg negative.  IMPRESSION: Migraine without aura, with status migrainosus, not intractable.  Probably menstrual-related.  PLAN: 1.  For preventative management, start topiramate 25mg  at bedtime for one week, then 50mg  at bedtime.  We can increase to 75mg  at bedtime in 5  weeks if needed. 2.  For abortive therapy, Maxalt. 3.  Limit use of pain relievers to no more than 2 days out of week to prevent risk of rebound or medication-overuse headache. 4.  Keep headache diary 5.  Exercise, hydration, caffeine cessation, sleep hygiene, monitor for and avoid triggers 6. Follow up 4 to 6 months.   Thank you for allowing me to take part in the care of this patient.  , DO  CC: , MD

## 2020-01-22 ENCOUNTER — Other Ambulatory Visit: Payer: Self-pay

## 2020-01-22 ENCOUNTER — Encounter: Payer: Self-pay | Admitting: Neurology

## 2020-01-22 ENCOUNTER — Ambulatory Visit (INDEPENDENT_AMBULATORY_CARE_PROVIDER_SITE_OTHER): Payer: 59 | Admitting: Neurology

## 2020-01-22 VITALS — BP 133/94 | HR 83 | Ht 64.0 in | Wt 154.8 lb

## 2020-01-22 DIAGNOSIS — G43001 Migraine without aura, not intractable, with status migrainosus: Secondary | ICD-10-CM | POA: Diagnosis not present

## 2020-01-22 MED ORDER — TOPIRAMATE 50 MG PO TABS: 50.0000 mg | ORAL_TABLET | Freq: Every day | ORAL | 5 refills | Status: DC

## 2020-01-22 NOTE — Patient Instructions (Signed)
  1. Start topiramate 50mg  tablet:  1/2 tablet at bedtime for a week, then increase to 1 tablet at bedtime.  If headaches not improved in 5 weeks, contact me and I will increase dose. 2. Take Maxalt 10mg  at earliest onset of headache.  May repeat dose once in 2 hours if needed.  Maximum 2 tablets in 24 hours. 3. Limit use of pain relievers to no more than 2 days out of the week.  These medications include acetaminophen, NSAIDs (ibuprofen/Advil/Motrin, naproxen/Aleve, triptans (Imitrex/sumatriptan), Excedrin, and narcotics.  This will help reduce risk of rebound headaches. 4. Be aware of common food triggers:  - Caffeine:  coffee, black tea, cola, Mt. Dew  - Chocolate  - Dairy:  aged cheeses (brie, blue, cheddar, gouda, [place], provolone, [place], Swiss, etc), chocolate milk, buttermilk, sour cream, limit eggs and yogurt  - Nuts, peanut butter  - Alcohol  - Cereals/grains:  FRESH breads (fresh bagels, sourdough, doughnuts), yeast productions  - Processed/canned/aged/cured meats (pre-packaged deli meats, hotdogs)  - MSG/glutamate:  soy sauce, flavor enhancer, pickled/preserved/marinated foods  - Sweeteners:  aspartame (Equal, Nutrasweet).  Sugar and Splenda are okay  - Vegetables:  legumes (lima beans, lentils, snow peas, fava beans, pinto peans, peas, garbanzo beans), sauerkraut, onions, olives, pickles  - Fruit:  avocados, bananas, citrus fruit (orange, lemon, grapefruit), mango  - Other:  Frozen meals, macaroni and cheese 5. Routine exercise 6. Stay adequately hydrated (aim for 64 oz water daily) 7. Keep headache diary 8. Maintain proper stress management 9. Maintain proper sleep hygiene 10. Do not skip meals 11. Consider supplements:  magnesium citrate 400mg  daily, riboflavin 400mg  daily, coenzyme Q10 100mg  three times daily. 12. Follow up in 4 to 6 months.

## 2020-02-08 ENCOUNTER — Telehealth: Payer: Self-pay | Admitting: Neurology

## 2020-02-08 NOTE — Telephone Encounter (Signed)
Patient said she was put on Topamax. She wants to know when she should up the dosage to 100mg ? She's been on 50mg  for 2-3 weeks. She is experiencing some side effects. She would like to know if these side effects will taper off? If so, when? She feels tired, and a little bit out of it. She is still experiencing migraines, so if the side effects will taper off she's interested in increasing the dosage to 100mg . Patient states sometimes she doesn't hear her phone ringing or the reception is poor so a detailed message can be left. Please call.

## 2020-02-08 NOTE — Telephone Encounter (Signed)
LMOVM, Please allow 4-6 weeks to see if the medication will help wit the Migraines. For the side effects let see if they taper off in the time frame. If they get worse and the Migraines are still there give [place] call and we could increase if you want or try something else.

## 2020-02-11 ENCOUNTER — Ambulatory Visit: Payer: 59 | Admitting: Neurology

## 2020-04-25 ENCOUNTER — Telehealth: Payer: Self-pay | Admitting: Neurology

## 2020-04-25 NOTE — Telephone Encounter (Signed)
Re advised pt that DR.Everlena Cooper is out of the office right now, But I will personally let him know to check her messages first so we can give her a call back first thing.

## 2020-04-25 NOTE — Telephone Encounter (Signed)
Patient called with concerns about depression. She said, "I've really been struggling with the negative side effects of it and I just can't take it anymore. I can't think straight and I'm in a constant fog. I'd like to taper off of it."  [facility] on [address]

## 2020-04-25 NOTE — Telephone Encounter (Signed)
Patient called in again and stated she wants to stop taking the Topirimate, but doesn't know if she has to taper down or not. I let her know a message had already been sent to Dr. Everlena Cooper a few minutes ago and he had already left for the day.

## 2020-04-25 NOTE — Telephone Encounter (Signed)
Toprimate 50mg  is not going to take it. Wants to discontinue but is going to stop it tonight. Just fyi, please advise in the am

## 2020-04-26 NOTE — Telephone Encounter (Signed)
She is only taking 50mg  daily, which is often a starting dose, so she can just stop it.  No taper needed.

## 2020-04-26 NOTE — Telephone Encounter (Signed)
Pt advised of DR.Jaffe note, as well as she could discussed alternate medication at her f/u in December.

## 2020-05-22 NOTE — Progress Notes (Signed)
NEUROLOGY FOLLOW UP OFFICE NOTE  Dawn Moss [id number]   Subjective:  Dawn Moss is a 44 year old right-handed female with ADHD and hypothyroidism who follows up for migraines.  UPDATE: Started topiramate in August but discontinued it almost a month ago due to side effects (depression). They are unchanged since last visit. Current NSAIDS:  Aleve Current analgesics:  none Current triptans:  none Current ergotamine:  none Current anti-emetic:  none Current muscle relaxants:  none Current anti-anxiolytic:  none Current sleep aide:  none Current Antihypertensive medications:  none Current Antidepressant medications:  Citalopram 40mg , Vyvanse Current Anticonvulsant medications:  none Current anti-CGRP:  none Current Vitamins/Herbal/Supplements:  none Current Antihistamines/Decongestants:  Claritin, Zyrtec Other therapy:  none Hormone/birth control:  none Other medications:  Dextroamphetamine, levotyroxine  Caffeine:  1 cup of coffee daily.  No soda or tea. Diet:  1 gallon water daily.  Not skips meals.  Grazes during the day and large dinner Exercise:  routine Depression:  Yes but stable with citalopram; Anxiety:  no Other pain:  no Sleep hygiene:  Good.  HISTORY: Onset:  44 years old, after having her first child.  Started once a month but gradually progressed.  They are severe left sided face and head pressure.  Associated with photophobia, phonophobia, dizziness, nausea but no visual disturbance, numbness or weakness.  They initially last 1 week (sometimes 2 weeks).  They respond to Maxalt in 30 to 60 minutes but she may have a dull headache for 12 hours and migraine will return.  They initially occur once a month. It tends to occur within the first 2 weeks after her period.  Triggers include heat, stress, loud noise.  Sleep helps relieve them.     Past NSAIDS:  Ibuprofen, naproxen Past analgesics:  Excedrin, Tylenol Past abortive triptans:  none Past abortive  ergotamine:  none Past muscle relaxants:  none Past anti-emetic:  none Past antihypertensive medications:  none Past antidepressant medications:  none Past anticonvulsant medications:  topiramate 50mg  (caused depression) Past anti-CGRP:  none Past vitamins/Herbal/Supplements:  CoQ10 Past antihistamines/decongestants:  none Other past therapies:  none   Family history of headache:  Mother (migraines)  PAST MEDICAL HISTORY: Past Medical History:  Diagnosis Date   Depression    Family history of malignant neoplasm of breast    Headache(784.0)    Hypothyroidism    Infertility, female     MEDICATIONS: Current Outpatient Medications on File Prior to Visit  Medication Sig Dispense Refill   citalopram (CELEXA) 20 MG tablet Take 1 tablet by mouth every day 30 tablet 3   levothyroxine (SYNTHROID, LEVOTHROID) 25 MCG tablet Take 1 daily by mouth qod 2 tabs by mouth qod 45 tablet 5   loratadine (CLARITIN) 10 MG tablet Take 10 mg by mouth daily.     pravastatin (PRAVACHOL) 20 MG tablet Take 1 tablet (20 mg total) by mouth daily. 90 tablet 3   rizatriptan (MAXALT) 10 MG tablet Take 10 mg by mouth 2 (two) times daily as needed.     topiramate (TOPAMAX) 50 MG tablet Take 1 tablet (50 mg total) by mouth at bedtime. 30 tablet 5   VYVANSE 60 MG capsule Take 60 mg by mouth every morning.     No current facility-administered medications on file prior to visit.    ALLERGIES: Allergies  Allergen Reactions   Codeine    Morphine And Related    Shellfish Allergy Nausea And Vomiting    FAMILY HISTORY: Family History  Problem  Relation Age of Onset   Crohn's disease Mother    Depression Mother    Hyperlipidemia Mother    Hypertension Father    Hyperlipidemia Father    Cancer Maternal Grandmother        breast cancer   Heart disease Maternal Grandfather    Diabetes Paternal Grandfather    Heart disease Paternal Grandfather     SOCIAL HISTORY: Social History    Socioeconomic History   Marital status: Married    Spouse name: Not on file   Number of children: Not on file   Years of education: Not on file   Highest education level: Not on file  Occupational History   Not on file  Tobacco Use   Smoking status: Former Smoker    Quit date: 10/07/2010    Years since quitting: 9.6   Smokeless tobacco: Never Used  Substance and Sexual Activity   Alcohol use: Yes    Comment: glass or 2 of wine per week   Drug use: No   Sexual activity: Yes    Birth control/protection: Other-see comments  Other Topics Concern   Not on file  Social History Narrative   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      Home Situation: lives with partner and daughter      Spiritual Beliefs: Christian      Lifestyle: walks about 30 minutes 2-3 times per week - 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 General: No acute distress.  Patient appears well-groomed.     Assessment/Plan:   Migraine without aura, without status migrainosus, not intractable  1.  Migraine prevention:  I would like to start a CGRP inhibitor - Aimovig 140mg  every 28 days.  She has failed topiramate and is already on 2 antidepressants.  A beta blocker may aggravate her depression. 2.  Migraine rescue:  Maxalt 10mg  3.  Limit use of pain relievers to no more than 2 days out of week to prevent risk of rebound or medication-overuse headache. 4.  Keep headache diary 5.  Follow up 6 months  ,  DO  CC: , MD

## 2020-05-23 ENCOUNTER — Encounter: Payer: Self-pay | Admitting: Neurology

## 2020-05-23 ENCOUNTER — Other Ambulatory Visit: Payer: Self-pay

## 2020-05-23 ENCOUNTER — Ambulatory Visit: Payer: 59 | Admitting: Neurology

## 2020-05-23 VITALS — BP 138/77 | HR 115 | Ht 64.0 in | Wt 155.8 lb

## 2020-05-23 DIAGNOSIS — G43001 Migraine without aura, not intractable, with status migrainosus: Secondary | ICD-10-CM

## 2020-05-23 MED ORDER — AIMOVIG 140 MG/ML ~~LOC~~ SOAJ: 140.0000 mg | SUBCUTANEOUS | 5 refills | Status: DC

## 2020-05-23 NOTE — Patient Instructions (Signed)
°  1. Start Aimovig 140mg  every 28 days.   2. Take rizatriptan 10mg  at earliest onset of headache.  May repeat dose once in 2 hours if needed.  Maximum 2 tablets in 24 hours. 3. Limit use of pain relievers to no more than 2 days out of the week.  These medications include acetaminophen, NSAIDs (ibuprofen/Advil/Motrin, naproxen/Aleve, triptans (Imitrex/sumatriptan), Excedrin, and narcotics.  This will help reduce risk of rebound headaches. 4. Be aware of common food triggers:  - Caffeine:  coffee, black tea, cola, Mt. Dew  - Chocolate  - Dairy:  aged cheeses (brie, blue, cheddar, gouda, [employer], provolone, [place], Swiss, etc), chocolate milk, buttermilk, sour cream, limit eggs and yogurt  - Nuts, peanut butter  - Alcohol  - Cereals/grains:  FRESH breads (fresh bagels, sourdough, doughnuts), yeast productions  - Processed/canned/aged/cured meats (pre-packaged deli meats, hotdogs)  - MSG/glutamate:  soy sauce, flavor enhancer, pickled/preserved/marinated foods  - Sweeteners:  aspartame (Equal, Nutrasweet).  Sugar and Splenda are okay  - Vegetables:  legumes (lima beans, lentils, snow peas, fava beans, pinto peans, peas, garbanzo beans), sauerkraut, onions, olives, pickles  - Fruit:  avocados, bananas, citrus fruit (orange, lemon, grapefruit), mango  - Other:  Frozen meals, macaroni and cheese 5. Routine exercise 6. Stay adequately hydrated (aim for 64 oz water daily) 7. Keep headache diary 8. Maintain proper stress management 9. Maintain proper sleep hygiene 10. Do not skip meals 11. Consider supplements:  magnesium citrate 400mg  daily, riboflavin 400mg  daily, coenzyme Q10 100mg  three times daily.

## 2020-05-31 ENCOUNTER — Encounter: Payer: Self-pay | Admitting: Neurology

## 2020-05-31 NOTE — Progress Notes (Signed)
Yesena Reaves (Key: [id number]) Aimovig 140MG /ML auto-injectors   Form PA Form [phone] NCPDP) Created 5 days ago Sent to Plan 5 days ago Plan Response 5 days ago Submit Clinical Questions 5 days ago Determination Favorable 4 days ago Message from Plan PA Case: ([id number], Status: Approved, Coverage Starts on: 05/27/2020 12:00:00 AM, Coverage Ends on: 11/23/2020 12:00:00 AM.

## 2020-06-14 ENCOUNTER — Telehealth: Payer: Self-pay | Admitting: Neurology

## 2020-06-14 ENCOUNTER — Other Ambulatory Visit: Payer: Self-pay | Admitting: Neurology

## 2020-06-14 MED ORDER — AIMOVIG 140 MG/ML ~~LOC~~ SOAJ: 140.0000 mg | SUBCUTANEOUS | 5 refills | Status: DC

## 2020-06-14 NOTE — Telephone Encounter (Signed)
Aimovig 140mg  was prescribed on 12/27 to the [facility] on [address] with 5 additional refills. Not sure why they didn't get it but I resent the prescription

## 2020-06-14 NOTE — Telephone Encounter (Signed)
I actually just received a request through cover my meds from pharmacy with new insurance. This PA has been submitted and awaiting determination from [facility]. They usually take 3 days. Will let you know once we get decision. Thanks!

## 2020-06-14 NOTE — Telephone Encounter (Signed)
Pt states that [facility] on [address] states they have not heard anything from [place] about a injection for migraine for the patient. Patient states she is not sure of the name of the medication  She has been trying to get this filled can you please check on this to see what they need from [place] so she can get this filled

## 2020-06-14 NOTE — Telephone Encounter (Signed)
Patient states that [facility] told her today that the aimovig needs prior auth done, she needs this done as soon as possible she is due for her next shot in about a week. She said please let her know when this is done

## 2020-06-14 NOTE — Telephone Encounter (Signed)
PA Case: [id number], Status: Approved, Coverage Starts on: 05/27/2020 12:00:00 AM, Coverage Ends on: 11/23/2020 12:00:00 AM.     Per pt her insurance has changed to [employer]. Please be on look out for the pt's my chart message with a copy of card.

## 2020-06-14 NOTE — Telephone Encounter (Signed)
If she doesn't get the PA in time, then we can give her another sample to carry her through the next month.

## 2020-06-14 NOTE — Telephone Encounter (Signed)
I let patient know PA is pending.

## 2020-06-15 ENCOUNTER — Other Ambulatory Visit: Payer: Self-pay | Admitting: Neurology

## 2020-06-17 ENCOUNTER — Encounter: Payer: Self-pay | Admitting: Neurology

## 2020-06-17 NOTE — Telephone Encounter (Signed)
Patient has been approved for the Aimovig. Just FYI. I sent her a message through MyChart to let her know. Thanks!

## 2020-06-17 NOTE — Progress Notes (Signed)
[facility] (Key: [id number]) Rx #: [id number] Aimovig 140MG /ML auto-injectors   Form Blue Form (CB) Created 3 days ago Sent to Plan 3 days ago Plan Response 3 days ago Submit Clinical Questions 3 days ago Determination Favorable 2 hours ago Message from Plan Effective from 06/14/2020 through 09/05/2020.

## 2020-08-29 NOTE — Progress Notes (Signed)
Dawn Moss (Key: [id number]) Aimovig 140MG /ML auto-injectors   Form Blue Form (CB) Created 5 days ago Sent to Plan 5 days ago Plan Response 5 days ago Submit Clinical Questions 5 days ago Determination Favorable 3 days ago Message from Plan Effective from 08/24/2020 through 08/23/2021.

## 2020-11-20 NOTE — Progress Notes (Signed)
NEUROLOGY FOLLOW UP OFFICE NOTE  Dawn Moss [id number]  Assessment/Plan:   Migraine without aura, without status migrainosus, not intractable  Migraine prevention:  Aimovig 140mg  Q28d Migraine rescue:  Maxalt 10mg  Limit use of pain relievers to no more than 2 days out of week to prevent risk of rebound or medication-overuse headache. Keep headache diary Follow up 6 months  Subjective:  Dawn Moss is a 45 year old right-handed female with ADHD and hypothyroidism who follows up for migraines.   UPDATE: Started Aimovig in December.  She is doing well. Intensity:  severe Duration:  15 minutes with rizatriptan Frequency:  1 in last 30 days Current NSAIDS:  Aleve Current analgesics:  none Current triptans:  none Current ergotamine:  none Current anti-emetic:  none Current muscle relaxants:  none Current anti-anxiolytic:  none Current sleep aide:  none Current Antihypertensive medications:  none Current Antidepressant medications:  Citalopram 40mg , Vyvanse Current Anticonvulsant medications:  none Current anti-CGRP:  none Current Vitamins/Herbal/Supplements:  none Current Antihistamines/Decongestants:  Claritin, Zyrtec Other therapy:  none Hormone/birth control:  none Other medications:  Dextroamphetamine, levotyroxine   Caffeine:  1 cup of coffee daily.  No soda or tea. Diet:  1 gallon water daily.  Not skips meals.  Grazes during the day and large dinner Exercise:  routine Depression:  Yes but stable with citalopram; Anxiety:  no Other pain:  no Sleep hygiene:  Good.   HISTORY:  Onset:  45 years old, after having her first child.  Started once a month but gradually progressed.  They are severe left sided face and head pressure.  Associated with photophobia, phonophobia, dizziness, nausea but no visual disturbance, numbness or weakness.  They initially last 1 week (sometimes 2 weeks).  They respond to Maxalt in 30 to 60 minutes but she may have a dull headache for  12 hours and migraine will return.  They initially occur once a month. It tends to occur within the first 2 weeks after her period.  Triggers include heat, stress, loud noise.  Sleep helps relieve them.       Past NSAIDS:  Ibuprofen, naproxen Past analgesics:  Excedrin, Tylenol Past abortive triptans:  none Past abortive ergotamine:  none Past muscle relaxants:  none Past anti-emetic:  none Past antihypertensive medications:  none Past antidepressant medications:  none Past anticonvulsant medications:  topiramate 50mg  (caused depression) Past anti-CGRP:  none Past vitamins/Herbal/Supplements:  CoQ10 Past antihistamines/decongestants:  none Other past therapies:  none     Family history of headache:  Mother (migraines)  PAST MEDICAL HISTORY: Past Medical History:  Diagnosis Date   Depression    Family history of malignant neoplasm of breast    Headache(784.0)    Hypothyroidism    Infertility, female     MEDICATIONS: Current Outpatient Medications on File Prior to Visit  Medication Sig Dispense Refill   citalopram (CELEXA) 20 MG tablet Take 1 tablet by mouth every day 30 tablet 3   Erenumab-aooe (AIMOVIG) 140 MG/ML SOAJ Inject 140 mg into the skin every 28 (twenty-eight) days. 1.12 mL 5   levothyroxine (SYNTHROID, LEVOTHROID) 25 MCG tablet Take 1 daily by mouth qod 2 tabs by mouth qod 45 tablet 5   loratadine (CLARITIN) 10 MG tablet Take 10 mg by mouth daily.     pravastatin (PRAVACHOL) 20 MG tablet Take 1 tablet (20 mg total) by mouth daily. 90 tablet 3   rizatriptan (MAXALT) 10 MG tablet Take 10 mg by mouth 2 (two) times daily as  needed.     topiramate (TOPAMAX) 50 MG tablet Take 1 tablet (50 mg total) by mouth at bedtime. (Patient not taking: Reported on 05/23/2020) 30 tablet 5   VYVANSE 60 MG capsule Take 60 mg by mouth every morning.     No current facility-administered medications on file prior to visit.    ALLERGIES: Allergies  Allergen Reactions   Codeine     Morphine And Related    Shellfish Allergy Nausea And Vomiting    FAMILY HISTORY: Family History  Problem Relation Age of Onset   Crohn's disease Mother    Depression Mother    Hyperlipidemia Mother    Hypertension Father    Hyperlipidemia Father    Cancer Maternal Grandmother        breast cancer   Heart disease Maternal Grandfather    Diabetes Paternal Grandfather    Heart disease Paternal Grandfather       Objective:  Blood pressure 124/80, pulse 90, height 5\' 4"  (1.626 m), weight 151 lb (68.5 kg), SpO2 96 %, unknown if currently breastfeeding. General: No acute distress.  Patient appears well-groomed.   Head:  Normocephalic/atraumatic Eyes:  Fundi examined but not visualized Neck: supple, no paraspinal tenderness, full range of motion Heart:  Regular rate and rhythm Lungs:  Clear to auscultation bilaterally Back: No paraspinal tenderness Neurological Exam: alert and oriented to person, place, and time.  Speech fluent and not dysarthric, language intact.  CN II-XII intact. Bulk and tone normal, muscle strength 5/5 throughout.  Sensation to light touch intact.  Deep tendon reflexes 2+ throughout.  Finger to nose testing intact.  Gait normal, Romberg negative.  , DO  CC: Shon Millet, MD

## 2020-11-21 ENCOUNTER — Other Ambulatory Visit: Payer: Self-pay

## 2020-11-21 ENCOUNTER — Ambulatory Visit (INDEPENDENT_AMBULATORY_CARE_PROVIDER_SITE_OTHER): Payer: BC Managed Care – PPO | Admitting: Neurology

## 2020-11-21 ENCOUNTER — Encounter: Payer: Self-pay | Admitting: Neurology

## 2020-11-21 VITALS — BP 124/80 | HR 90 | Ht 64.0 in | Wt 151.0 lb

## 2020-11-21 DIAGNOSIS — G43001 Migraine without aura, not intractable, with status migrainosus: Secondary | ICD-10-CM

## 2020-11-21 MED ORDER — AIMOVIG 140 MG/ML ~~LOC~~ SOAJ: 140.0000 mg | SUBCUTANEOUS | 5 refills | Status: DC

## 2020-11-21 MED ORDER — RIZATRIPTAN BENZOATE 10 MG PO TABS: ORAL_TABLET | ORAL | 5 refills | Status: DC

## 2020-11-21 NOTE — Patient Instructions (Signed)
Refilled Aimovig and rizatriptan

## 2020-11-26 IMAGING — US US ABDOMEN COMPLETE
1 series · 13 of 25 positions shown · non-contrast
Comparison: None.

CLINICAL DATA: Abdominal pain and nausea

EXAM:
ABDOMEN ULTRASOUND COMPLETE

[Series 1: us abdomen complete · 0.19mm/px · 13 of 91 slices shown]
[im 1/91]
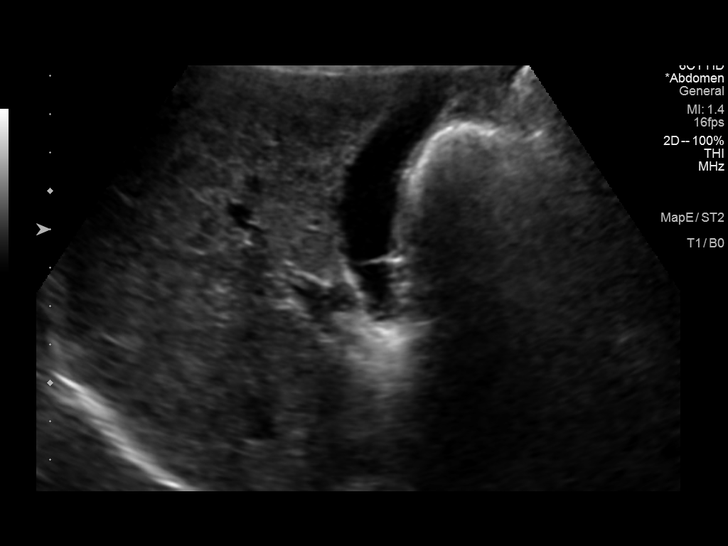
[im 8/91]
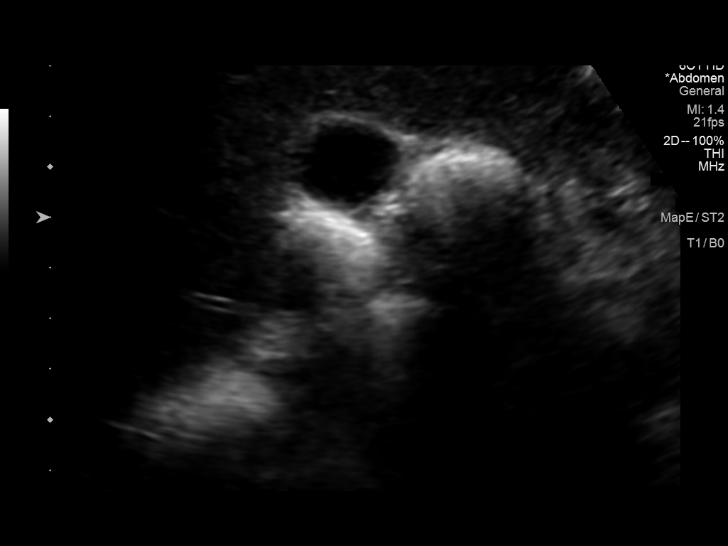
[im 16/91]
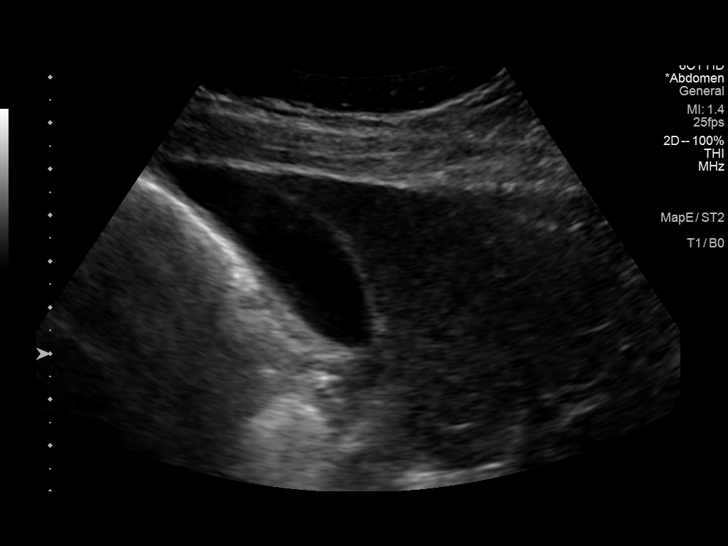
[im 23/91]
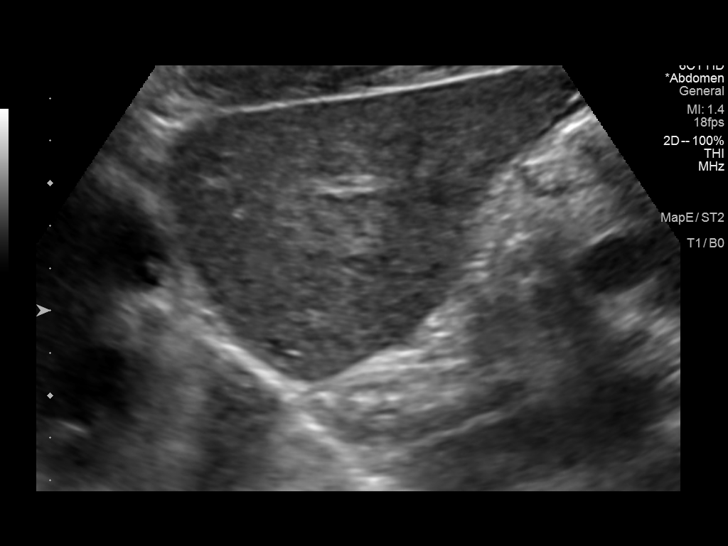
[im 31/91]
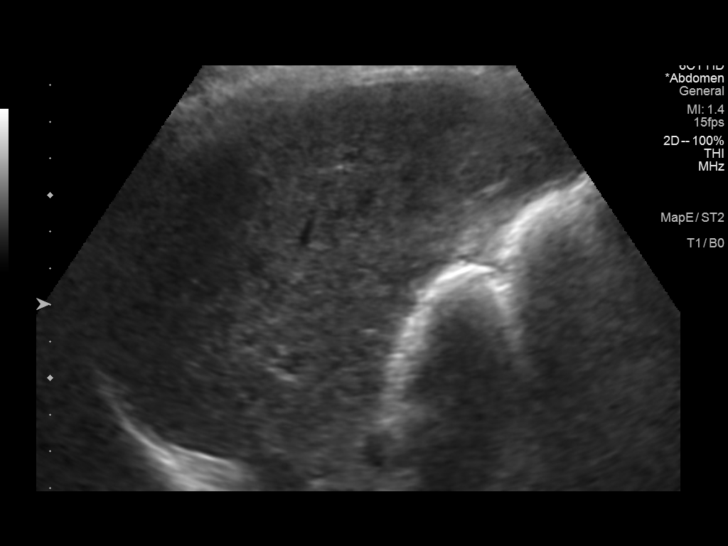
[im 38/91]
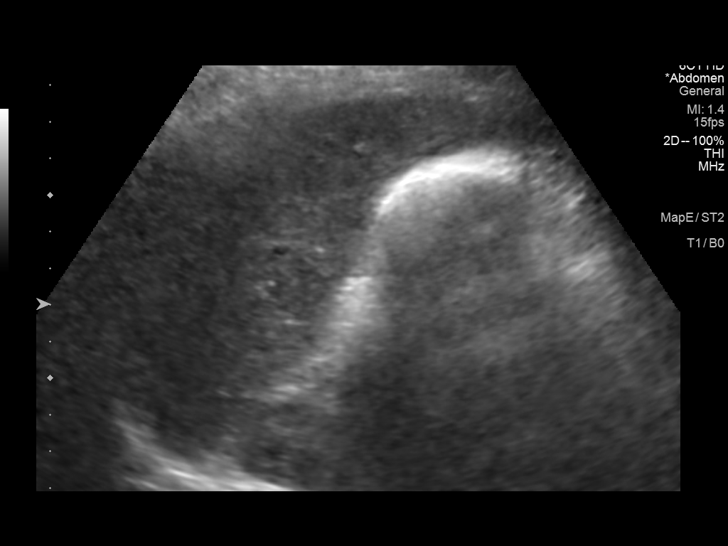
[im 46/91]
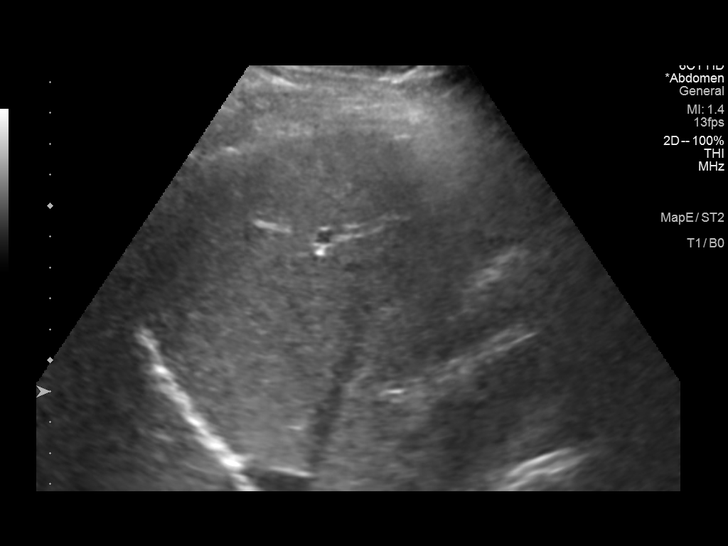
[im 53/91]
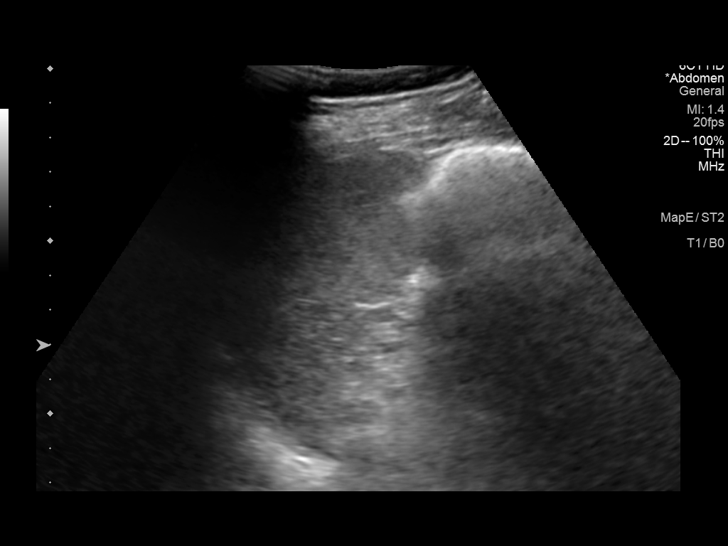
[im 61/91]
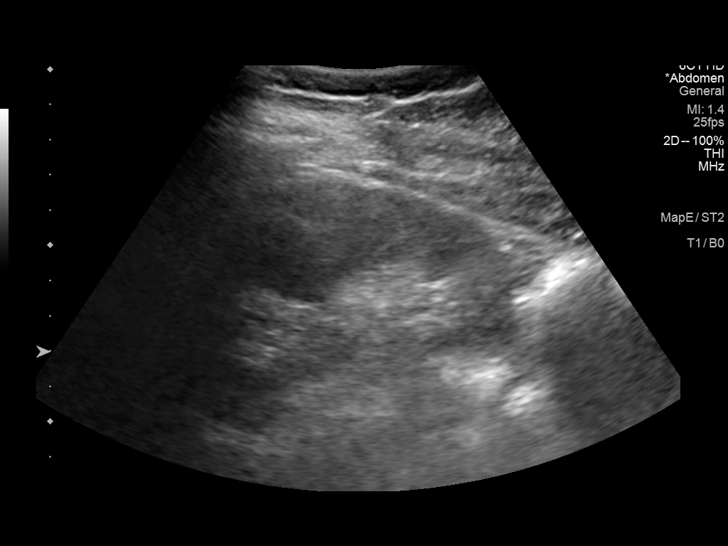
[im 68/91]
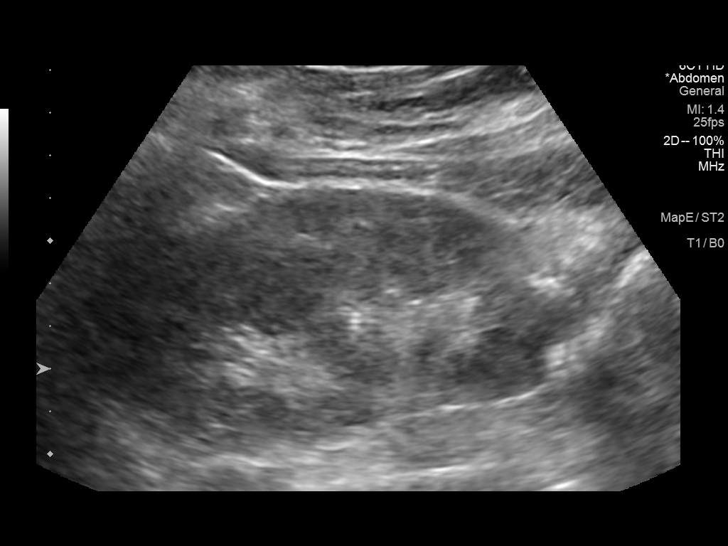
[im 76/91]
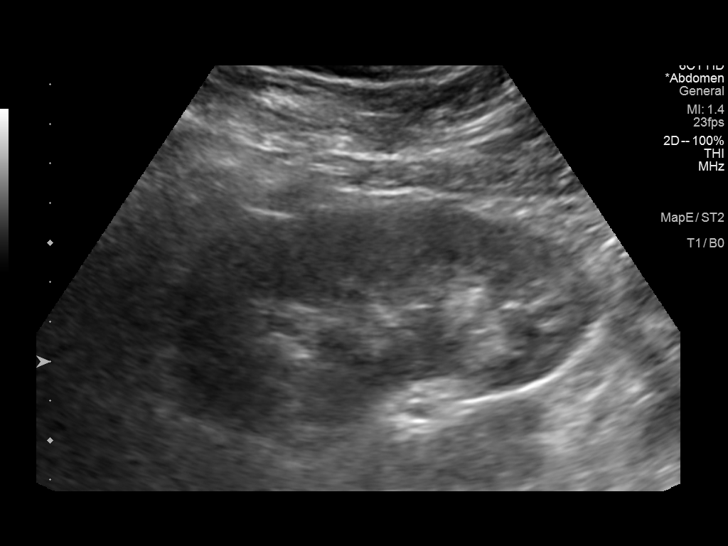
[im 83/91]
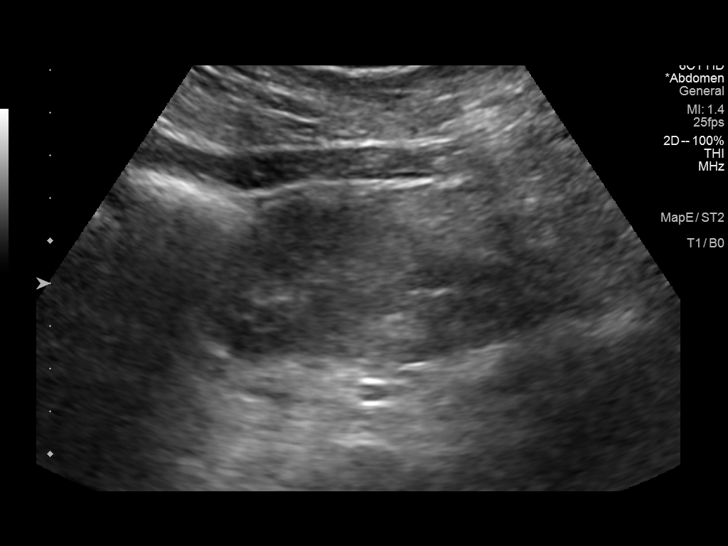
[im 91/91]
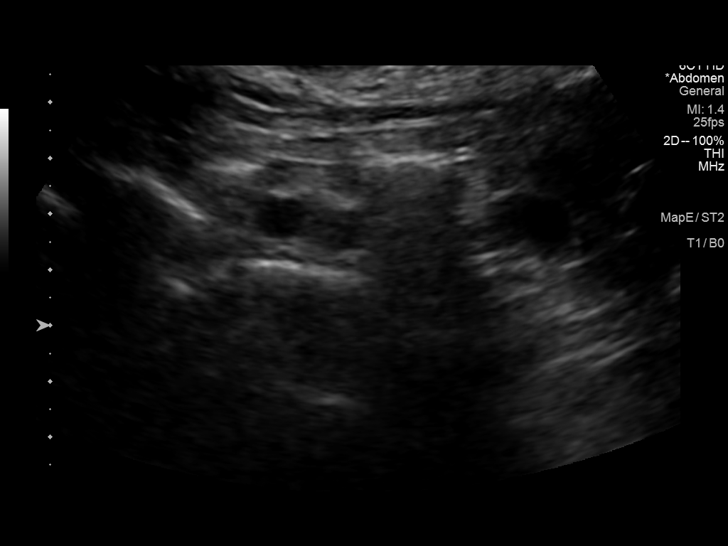

[13 of 25 positions shown; findings below may reference images not displayed]

FINDINGS: Gallbladder: No gallstones or wall thickening visualized. There is
no pericholecystic fluid. No sonographic Murphy sign noted by
sonographer.

Common bile duct: Diameter: 4 mm. No intrahepatic, common hepatic,
or common bile duct dilatation.

Liver: No focal lesion identified. Liver echogenicity overall is
increased. Portal vein is patent on color Doppler imaging with
normal direction of blood flow towards the liver.

IVC: No abnormality visualized.

Pancreas: No pancreatic mass or inflammatory focus.

Spleen: Size and appearance within normal limits.

Right Kidney: Length: 10.1 cm. Echogenicity within normal limits. No
mass or hydronephrosis visualized.

Left Kidney: Length: 10.7 cm. Echogenicity within normal limits. No
mass or hydronephrosis visualized.

Abdominal aorta: No aneurysm visualized.

Other findings: No demonstrable ascites.
IMPRESSION: Diffuse increase in liver echogenicity, a finding indicative of
hepatic steatosis. No focal liver lesions are evident; it must be
cautioned that the sensitivity of ultrasound for detection of focal
liver lesions is diminished in this circumstance.

Study otherwise unremarkable.

## 2020-12-02 NOTE — Progress Notes (Signed)
Dawn Moss (Key: [id number]) Aimovig 140MG /ML auto-injectors   Form Blue Form (CB) Created 1 minute ago Sent to Plan 1 minute ago Plan Response 1 minute ago Submit Clinical Questions Determination pending

## 2020-12-07 NOTE — Progress Notes (Signed)
Aimovig 140mg  PA approved 08/24/20 until 08/23/2021

## 2021-01-26 DIAGNOSIS — E7800 Pure hypercholesterolemia, unspecified: Secondary | ICD-10-CM | POA: Diagnosis not present

## 2021-01-26 DIAGNOSIS — F900 Attention-deficit hyperactivity disorder, predominantly inattentive type: Secondary | ICD-10-CM | POA: Diagnosis not present

## 2021-01-26 DIAGNOSIS — E039 Hypothyroidism, unspecified: Secondary | ICD-10-CM | POA: Diagnosis not present

## 2021-01-26 DIAGNOSIS — Z79899 Other long term (current) drug therapy: Secondary | ICD-10-CM | POA: Diagnosis not present

## 2021-01-26 DIAGNOSIS — Z23 Encounter for immunization: Secondary | ICD-10-CM | POA: Diagnosis not present

## 2021-01-26 DIAGNOSIS — R454 Irritability and anger: Secondary | ICD-10-CM | POA: Diagnosis not present

## 2021-05-31 NOTE — Progress Notes (Deleted)
Virtual Visit via Video Note The purpose of this virtual visit is to provide medical care while limiting exposure to the novel coronavirus.    Consent was obtained for video visit:  Yes.   Answered questions that patient had about telehealth interaction:  Yes.   I discussed the limitations, risks, security and privacy concerns of performing an evaluation and management service by telemedicine. I also discussed with the patient that there may be a patient responsible charge related to this service. The patient expressed understanding and agreed to proceed.  Pt location: Home Physician Location: office Name of referring provider:  Gaynelle Arabian, MD I connected with Dawn Moss at patients initiation/request on 06/01/2021 at  8:50 AM EST by video enabled telemedicine application and verified that I am speaking with the correct person using two identifiers. Pt MRN:  [id number] Pt DOB:  1976/02/06 Video Participants:  Dawn Moss  Assessment and Plan:    Migraine without aura, without status migrainosus, not intractable   Migraine prevention:  Aimovig 140mg  Q28d Migraine rescue:  Maxalt 10mg  Limit use of pain relievers to no more than 2 days out of week to prevent risk of rebound or medication-overuse headache. Keep headache diary Follow up 6 months  History of Present Illness:  Dawn Moss is a 46 year old right-handed female with ADHD and hypothyroidism who follows up for migraines.   UPDATE: Started Aimovig in December.  She is doing well. Intensity:  severe Duration:  15 minutes with rizatriptan Frequency:  1 in last 30 days Current NSAIDS:  Aleve Current analgesics:  none Current triptans:  none Current ergotamine:  none Current anti-emetic:  none Current muscle relaxants:  none Current anti-anxiolytic:  none Current sleep aide:  none Current Antihypertensive medications:  none Current Antidepressant medications:  Citalopram 40mg , Vyvanse Current Anticonvulsant  medications:  none Current anti-CGRP:  none Current Vitamins/Herbal/Supplements:  none Current Antihistamines/Decongestants:  Claritin, Zyrtec Other therapy:  none Hormone/birth control:  none Other medications:  Dextroamphetamine, levotyroxine   Caffeine:  1 cup of coffee daily.  No soda or tea. Diet:  1 gallon water daily.  Not skips meals.  Grazes during the day and large dinner Exercise:  routine Depression:  Yes but stable with citalopram; Anxiety:  no Other pain:  no Sleep hygiene:  Good.   HISTORY:  Onset:  46 years old, after having her first child.  Started once a month but gradually progressed.  They are severe left sided face and head pressure.  Associated with photophobia, phonophobia, dizziness, nausea but no visual disturbance, numbness or weakness.  They initially last 1 week (sometimes 2 weeks).  They respond to Maxalt in 30 to 60 minutes but she may have a dull headache for 12 hours and migraine will return.  They initially occur once a month. It tends to occur within the first 2 weeks after her period.  Triggers include heat, stress, loud noise.  Sleep helps relieve them.       Past NSAIDS:  Ibuprofen, naproxen Past analgesics:  Excedrin, Tylenol Past abortive triptans:  none Past abortive ergotamine:  none Past muscle relaxants:  none Past anti-emetic:  none Past antihypertensive medications:  none Past antidepressant medications:  none Past anticonvulsant medications:  topiramate 50mg  (caused depression) Past anti-CGRP:  none Past vitamins/Herbal/Supplements:  CoQ10 Past antihistamines/decongestants:  none Other past therapies:  none     Family history of headache:  Mother (migraines)  Past Medical History: Past Medical History:  Diagnosis Date  Depression    Family history of malignant neoplasm of breast    Headache(784.0)    Hypothyroidism    Infertility, female     Medications: Outpatient Encounter Medications as of 06/01/2021  Medication Sig    citalopram (CELEXA) 20 MG tablet Take 1 tablet by mouth every day   Erenumab-aooe (AIMOVIG) 140 MG/ML SOAJ Inject 140 mg into the skin every 28 (twenty-eight) days.   levothyroxine (SYNTHROID, LEVOTHROID) 25 MCG tablet Take 1 daily by mouth qod 2 tabs by mouth qod   loratadine (CLARITIN) 10 MG tablet Take 10 mg by mouth daily.   rizatriptan (MAXALT) 10 MG tablet Take 1 tablet at earliest onset of migraine.  May repeat in 2 hours.  Maximum 2 tablets in 24 hours   VYVANSE 60 MG capsule Take 60 mg by mouth every morning.   No facility-administered encounter medications on file as of 06/01/2021.    Allergies: Allergies  Allergen Reactions   Codeine    Morphine And Related    Shellfish Allergy Nausea And Vomiting    Family History: Family History  Problem Relation Age of Onset   Crohn's disease Mother    Depression Mother    Hyperlipidemia Mother    Hypertension Father    Hyperlipidemia Father    Cancer Maternal Grandmother        breast cancer   Heart disease Maternal Grandfather    Diabetes Paternal Grandfather    Heart disease Paternal Grandfather     Observations/Objective:   *** No acute distress.  Alert and oriented.  Speech fluent and not dysarthric.  Language intact.    Follow Up Instructions:    -I discussed the assessment and treatment plan with the patient. The patient was provided an opportunity to ask questions and all were answered. The patient agreed with the plan and demonstrated an understanding of the instructions.   The patient was advised to call back or seek an in-person evaluation if the symptoms worsen or if the condition fails to improve as anticipated.   Dudley Major, DO

## 2021-06-01 ENCOUNTER — Telehealth: Payer: BC Managed Care – PPO | Admitting: Neurology

## 2021-06-12 NOTE — Progress Notes (Signed)
Virtual Visit via Video Note The purpose of this virtual visit is to provide medical care while limiting exposure to the novel coronavirus.    Consent was obtained for video visit:  Yes.   Answered questions that patient had about telehealth interaction:  Yes.   I discussed the limitations, risks, security and privacy concerns of performing an evaluation and management service by telemedicine. I also discussed with the patient that there may be a patient responsible charge related to this service. The patient expressed understanding and agreed to proceed.  Pt location: Home Physician Location: office Name of referring provider:  Gaynelle Arabian, MD I connected with Dawn Moss at patients initiation/request on 06/13/2021 at  8:30 AM EST by video enabled telemedicine application and verified that I am speaking with the correct person using two identifiers. Pt MRN:  [id number] Pt DOB:  Oct 06, 1975 Video Participants:  Dawn Moss  Assessment and Plan:    Migraine without aura, without status migrainosus, not intractable   Migraine prevention:  Aimovig 140mg  Q28d Migraine rescue:  Maxalt 10mg  Limit use of pain relievers to no more than 2 days out of week to prevent risk of rebound or medication-overuse headache. Keep headache diary Follow up 1 year  History of Present Illness:  Dawn Moss is a 46 year old right-handed female with ADHD and hypothyroidism who follows up for migraines.   UPDATE: Intensity:  mild Duration:  15 minutes with rizatriptan Frequency:  once every other month She had a severe migraine 2 weeks ago because she was 2 weeks past due for her injection. Current NSAIDS:  Aleve Current analgesics:  none Current triptans:  rizatriptan 10mg  Current ergotamine:  none Current anti-emetic:  none Current muscle relaxants:  none Current anti-anxiolytic:  none Current sleep aide:  none Current Antihypertensive medications:  none Current Antidepressant medications:   Citalopram 40mg , Vyvanse Current Anticonvulsant medications:  none Current anti-CGRP:  Aimovig 140mg  Current Vitamins/Herbal/Supplements:  none Current Antihistamines/Decongestants:  Claritin, Zyrtec Other therapy:  none Hormone/birth control:  none Other medications:  Dextroamphetamine, levotyroxine   Caffeine:  1 cup of coffee daily.  No soda or tea. Diet:  1 gallon water daily.  Not skips meals.  Grazes during the day and large dinner Exercise:  routine Depression:  Yes but stable with citalopram; Anxiety:  no Other pain:  no Sleep hygiene:  Good.   HISTORY:  Onset:  46 years old, after having her first child.  Started once a month but gradually progressed.  They are severe left sided face and head pressure.  Associated with photophobia, phonophobia, dizziness, nausea but no visual disturbance, numbness or weakness.  They initially last 1 week (sometimes 2 weeks).  They respond to Maxalt in 30 to 60 minutes but she may have a dull headache for 12 hours and migraine will return.  They initially occur once a month. It tends to occur within the first 2 weeks after her period.  Triggers include heat, stress, loud noise.  Sleep helps relieve them.       Past NSAIDS:  Ibuprofen, naproxen Past analgesics:  Excedrin, Tylenol Past abortive triptans:  none Past abortive ergotamine:  none Past muscle relaxants:  none Past anti-emetic:  none Past antihypertensive medications:  none Past antidepressant medications:  none Past anticonvulsant medications:  topiramate 50mg  (caused depression) Past anti-CGRP:  none Past vitamins/Herbal/Supplements:  CoQ10 Past antihistamines/decongestants:  none Other past therapies:  none     Family history of headache:  Mother (migraines)  Past  Medical History: Past Medical History:  Diagnosis Date   Depression    Family history of malignant neoplasm of breast    Headache(784.0)    Hypothyroidism    Infertility, female     Medications: Outpatient  Encounter Medications as of 06/13/2021  Medication Sig   citalopram (CELEXA) 20 MG tablet Take 1 tablet by mouth every day   Erenumab-aooe (AIMOVIG) 140 MG/ML SOAJ Inject 140 mg into the skin every 28 (twenty-eight) days.   levothyroxine (SYNTHROID, LEVOTHROID) 25 MCG tablet Take 1 daily by mouth qod 2 tabs by mouth qod   loratadine (CLARITIN) 10 MG tablet Take 10 mg by mouth daily.   rizatriptan (MAXALT) 10 MG tablet Take 1 tablet at earliest onset of migraine.  May repeat in 2 hours.  Maximum 2 tablets in 24 hours   VYVANSE 60 MG capsule Take 60 mg by mouth every morning.   No facility-administered encounter medications on file as of 06/13/2021.    Allergies: Allergies  Allergen Reactions   Codeine    Morphine And Related    Shellfish Allergy Nausea And Vomiting    Family History: Family History  Problem Relation Age of Onset   Crohn's disease Mother    Depression Mother    Hyperlipidemia Mother    Hypertension Father    Hyperlipidemia Father    Cancer Maternal Grandmother        breast cancer   Heart disease Maternal Grandfather    Diabetes Paternal Grandfather    Heart disease Paternal Grandfather     Observations/Objective:   Height 5\' 4"  (1.626 m), weight 140 lb (63.5 kg), unknown if currently breastfeeding. No acute distress.  Alert and oriented.  Speech fluent and not dysarthric.  Language intact.    Follow Up Instructions:    -I discussed the assessment and treatment plan with the patient. The patient was provided an opportunity to ask questions and all were answered. The patient agreed with the plan and demonstrated an understanding of the instructions.   The patient was advised to call back or seek an in-person evaluation if the symptoms worsen or if the condition fails to improve as anticipated.   Dudley Major, DO

## 2021-06-13 ENCOUNTER — Encounter: Payer: Self-pay | Admitting: Neurology

## 2021-06-13 ENCOUNTER — Telehealth (INDEPENDENT_AMBULATORY_CARE_PROVIDER_SITE_OTHER): Payer: Self-pay | Admitting: Neurology

## 2021-06-13 ENCOUNTER — Other Ambulatory Visit: Payer: Self-pay

## 2021-06-13 VITALS — Ht 64.0 in | Wt 140.0 lb

## 2021-06-13 DIAGNOSIS — G43001 Migraine without aura, not intractable, with status migrainosus: Secondary | ICD-10-CM

## 2021-06-13 MED ORDER — RIZATRIPTAN BENZOATE 10 MG PO TABS: ORAL_TABLET | ORAL | 5 refills | Status: DC

## 2021-06-13 MED ORDER — AIMOVIG 140 MG/ML ~~LOC~~ SOAJ: 140.0000 mg | SUBCUTANEOUS | 5 refills | Status: DC

## 2021-07-24 DIAGNOSIS — Z1231 Encounter for screening mammogram for malignant neoplasm of breast: Secondary | ICD-10-CM | POA: Diagnosis not present

## 2021-07-24 DIAGNOSIS — Z124 Encounter for screening for malignant neoplasm of cervix: Secondary | ICD-10-CM | POA: Diagnosis not present

## 2021-07-24 DIAGNOSIS — Z6826 Body mass index (BMI) 26.0-26.9, adult: Secondary | ICD-10-CM | POA: Diagnosis not present

## 2021-07-24 DIAGNOSIS — Z01419 Encounter for gynecological examination (general) (routine) without abnormal findings: Secondary | ICD-10-CM | POA: Diagnosis not present

## 2021-07-31 DIAGNOSIS — E7800 Pure hypercholesterolemia, unspecified: Secondary | ICD-10-CM | POA: Diagnosis not present

## 2021-07-31 DIAGNOSIS — F900 Attention-deficit hyperactivity disorder, predominantly inattentive type: Secondary | ICD-10-CM | POA: Diagnosis not present

## 2021-07-31 DIAGNOSIS — R454 Irritability and anger: Secondary | ICD-10-CM | POA: Diagnosis not present

## 2021-07-31 DIAGNOSIS — E039 Hypothyroidism, unspecified: Secondary | ICD-10-CM | POA: Diagnosis not present

## 2021-07-31 DIAGNOSIS — Z79899 Other long term (current) drug therapy: Secondary | ICD-10-CM | POA: Diagnosis not present

## 2021-08-21 ENCOUNTER — Telehealth: Payer: Self-pay | Admitting: Pharmacy Technician

## 2021-08-21 ENCOUNTER — Other Ambulatory Visit (HOSPITAL_COMMUNITY): Payer: Self-pay

## 2021-08-21 NOTE — Telephone Encounter (Signed)
Submitted a Prior Authorization request to [id number]  for  Aimovig 140mg   via CoverMyMeds. Will update once we receive a response. ? ? ?Key: ? ?

## 2021-08-23 NOTE — Telephone Encounter (Signed)
Submitted additional chart notes to document patient's headaches pre-starting therapy. ? ?Case# [id number] ?

## 2021-08-31 NOTE — Telephone Encounter (Signed)
Letter received from [facility] # [id number], approved for 1 ml every 28 days for 12 months. ?

## 2021-11-01 DIAGNOSIS — L0390 Cellulitis, unspecified: Secondary | ICD-10-CM | POA: Diagnosis not present

## 2021-11-06 DIAGNOSIS — L723 Sebaceous cyst: Secondary | ICD-10-CM | POA: Diagnosis not present

## 2021-11-21 DIAGNOSIS — L720 Epidermal cyst: Secondary | ICD-10-CM | POA: Diagnosis not present

## 2021-11-26 ENCOUNTER — Other Ambulatory Visit: Payer: Self-pay | Admitting: Neurology

## 2021-11-27 NOTE — Telephone Encounter (Signed)
Patient will need follow up appt for any more refills

## 2021-11-29 ENCOUNTER — Telehealth: Payer: Self-pay | Admitting: Neurology

## 2021-11-29 ENCOUNTER — Other Ambulatory Visit: Payer: Self-pay

## 2021-11-29 MED ORDER — RIZATRIPTAN BENZOATE 10 MG PO TABS: ORAL_TABLET | ORAL | 5 refills | Status: DC

## 2021-11-29 NOTE — Telephone Encounter (Signed)
Patient accidentally threw away her Maxalt 10mg . [facility] usually put them in a bottle but they left it in a packet this time. She would like to know If she can get another refill sent in, so she can pay out of pocket.

## 2021-11-29 NOTE — Telephone Encounter (Signed)
Sent in prescription and also sent a my chart message to let the patient know

## 2021-11-29 NOTE — Telephone Encounter (Signed)
Message sen to Rinaldo Cloud to have patient scheduled. Thank you

## 2022-03-02 DIAGNOSIS — Z23 Encounter for immunization: Secondary | ICD-10-CM | POA: Diagnosis not present

## 2022-03-02 DIAGNOSIS — E039 Hypothyroidism, unspecified: Secondary | ICD-10-CM | POA: Diagnosis not present

## 2022-03-02 DIAGNOSIS — R454 Irritability and anger: Secondary | ICD-10-CM | POA: Diagnosis not present

## 2022-03-02 DIAGNOSIS — F900 Attention-deficit hyperactivity disorder, predominantly inattentive type: Secondary | ICD-10-CM | POA: Diagnosis not present

## 2022-03-02 DIAGNOSIS — E7800 Pure hypercholesterolemia, unspecified: Secondary | ICD-10-CM | POA: Diagnosis not present

## 2022-03-02 DIAGNOSIS — Z79899 Other long term (current) drug therapy: Secondary | ICD-10-CM | POA: Diagnosis not present

## 2022-04-02 ENCOUNTER — Encounter: Payer: Self-pay | Admitting: Neurology

## 2022-04-02 NOTE — Progress Notes (Unsigned)
Virtual Visit via Video Note The purpose of this virtual visit is to provide medical care while limiting exposure to the novel coronavirus.    Consent was obtained for video visit:  Yes.   Answered questions that patient had about telehealth interaction:  Yes.   I discussed the limitations, risks, security and privacy concerns of performing an evaluation and management service by telemedicine. I also discussed with the patient that there may be a patient responsible charge related to this service. The patient expressed understanding and agreed to proceed.  Pt location: Home Physician Location: office Name of referring provider:  Gaynelle Arabian, MD I connected with Dawn Moss at patients initiation/request on 04/03/2022 at 10:50 AM EST by video enabled telemedicine application and verified that I am speaking with the correct person using two identifiers. Pt MRN:  [id number] Pt DOB:  04-21-1976 Video Participants:  Dawn Moss  Assessment and Plan:    Migraine without aura, without status migrainosus, not intractable   Migraine prevention:  Aimovig140mg  Q28d Migraine rescue:  Maxalt 10mg  Limit use of pain relievers to no more than 2 days out of week to prevent risk of rebound or medication-overuse headache. Keep headache diary Follow up 1 year  History of Present Illness:  Dawn Moss is a 46 year old right-handed female with ADHD and hypothyroidism who follows up for migraines.   UPDATE: She is doing well Intensity:  mild-moderate Duration:  15 minutes with rizatriptan Frequency:  once every other month.  Will occur if she is late taking her Aimovig injection (such as if her pharmacy can't get it in on-time. She had a severe migraine 2 weeks ago because she was 2 weeks past due for her injection. Current NSAIDS:  Aleve Current analgesics:  none Current triptans:  rizatriptan 10mg  Current ergotamine:  none Current anti-emetic:  none Current muscle relaxants:  none Current  anti-anxiolytic:  none Current sleep aide:  none Current Antihypertensive medications:  none Current Antidepressant medications:  Citalopram 40mg  Current Anticonvulsant medications:  none Current anti-CGRP:  Aimovig 140mg  Current Vitamins/Herbal/Supplements:  none Current Antihistamines/Decongestants:  Claritin, Zyrtec Other therapy:  none Hormone/birth control:  none Other medications:  Dextroamphetamine, levotyroxine   Caffeine:  1 cup of coffee daily.  No soda or tea. Diet:  1 gallon water daily.  Not skips meals.  Grazes during the day and large dinner Exercise:  routine Depression:  Yes but stable with citalopram; Anxiety:  no Other pain:  no Sleep hygiene:  Good.   HISTORY:  Onset:  46 years old, after having her first child.  Started once a month but gradually progressed.  They are severe left sided face and head pressure.  Associated with photophobia, phonophobia, dizziness, nausea but no visual disturbance, numbness or weakness.  They initially last 1 week (sometimes 2 weeks).  They respond to Maxalt in 30 to 60 minutes but she may have a dull headache for 12 hours and migraine will return.  They initially occur once a month. It tends to occur within the first 2 weeks after her period.  Triggers include heat, stress, loud noise.  Sleep helps relieve them.       Past NSAIDS:  Ibuprofen, naproxen Past analgesics:  Excedrin, Tylenol Past abortive triptans:  none Past abortive ergotamine:  none Past muscle relaxants:  none Past anti-emetic:  none Past antihypertensive medications:  none Past antidepressant medications:  none Past anticonvulsant medications:  topiramate 50mg  (caused depression) Past anti-CGRP:  none Past vitamins/Herbal/Supplements:  CoQ10  Past antihistamines/decongestants:  none Other past therapies:  none     Family history of headache:  Mother (migraines)  Past Medical History: Past Medical History:  Diagnosis Date   Depression    Family history of  malignant neoplasm of breast    Headache(784.0)    Hypothyroidism    Infertility, female     Medications: Outpatient Encounter Medications as of 04/03/2022  Medication Sig   AIMOVIG 140 MG/ML SOAJ ADMINISTER 1 ML UNDER THE SKIN EVERY 28 DAYS   citalopram (CELEXA) 20 MG tablet Take 1 tablet by mouth every day   levothyroxine (SYNTHROID, LEVOTHROID) 25 MCG tablet Take 1 daily by mouth qod 2 tabs by mouth qod   loratadine (CLARITIN) 10 MG tablet Take 10 mg by mouth daily.   rizatriptan (MAXALT) 10 MG tablet Take 1 tablet at earliest onset of migraine.  May repeat in 2 hours.  Maximum 2 tablets in 24 hours   VYVANSE 60 MG capsule Take 60 mg by mouth every morning.   No facility-administered encounter medications on file as of 04/03/2022.    Allergies: Allergies  Allergen Reactions   Codeine    Morphine And Related    Shellfish Allergy Nausea And Vomiting    Family History: Family History  Problem Relation Age of Onset   Crohn's disease Mother    Depression Mother    Hyperlipidemia Mother    Hypertension Father    Hyperlipidemia Father    Cancer Maternal Grandmother        breast cancer   Heart disease Maternal Grandfather    Diabetes Paternal Grandfather    Heart disease Paternal Grandfather     Observations/Objective:   No acute distress.  Alert and oriented.  Speech fluent and not dysarthric.  Language intact.    Follow Up Instructions:    -I discussed the assessment and treatment plan with the patient. The patient was provided an opportunity to ask questions and all were answered. The patient agreed with the plan and demonstrated an understanding of the instructions.   The patient was advised to call back or seek an in-person evaluation if the symptoms worsen or if the condition fails to improve as anticipated.   Cira Servant, DO

## 2022-04-03 ENCOUNTER — Telehealth (INDEPENDENT_AMBULATORY_CARE_PROVIDER_SITE_OTHER): Payer: Self-pay | Admitting: Neurology

## 2022-04-03 ENCOUNTER — Encounter: Payer: Self-pay | Admitting: Neurology

## 2022-04-03 DIAGNOSIS — G43001 Migraine without aura, not intractable, with status migrainosus: Secondary | ICD-10-CM

## 2022-04-05 ENCOUNTER — Other Ambulatory Visit: Payer: Self-pay | Admitting: Neurology

## 2022-06-06 DIAGNOSIS — Z1211 Encounter for screening for malignant neoplasm of colon: Secondary | ICD-10-CM | POA: Diagnosis not present

## 2022-08-07 ENCOUNTER — Telehealth: Payer: Self-pay

## 2022-08-07 NOTE — Telephone Encounter (Signed)
Patient LVM yesterday asking if we can re-send her Amovig to [place], [place], [place] [address]. Damira requested a refill two weeks ago with current pharmacy, has been out since last week and got a call from the pharmacy stating they have insurance issues going on so patient canceled order with them. Requesting samples as well. Asked for a call back

## 2022-08-07 NOTE — Telephone Encounter (Signed)
Tried calling patient no answer. LMOVM. Aimoivg is one of the medication that the Copay card.

## 2022-08-25 ENCOUNTER — Other Ambulatory Visit: Payer: Self-pay | Admitting: Neurology

## 2022-08-29 ENCOUNTER — Telehealth: Payer: Self-pay | Admitting: Anesthesiology

## 2022-08-29 NOTE — Telephone Encounter (Signed)
Pt called stating her Aimovig is needed a PA. Her next shot is due in 4 days.

## 2022-09-05 DIAGNOSIS — E039 Hypothyroidism, unspecified: Secondary | ICD-10-CM | POA: Diagnosis not present

## 2022-09-05 DIAGNOSIS — F900 Attention-deficit hyperactivity disorder, predominantly inattentive type: Secondary | ICD-10-CM | POA: Diagnosis not present

## 2022-09-05 DIAGNOSIS — Z79899 Other long term (current) drug therapy: Secondary | ICD-10-CM | POA: Diagnosis not present

## 2022-09-05 DIAGNOSIS — R454 Irritability and anger: Secondary | ICD-10-CM | POA: Diagnosis not present

## 2022-09-05 DIAGNOSIS — E7800 Pure hypercholesterolemia, unspecified: Secondary | ICD-10-CM | POA: Diagnosis not present

## 2022-09-05 NOTE — Telephone Encounter (Signed)
Medication Samples have been provided to the patient.  Drug name: Aimovig       Strength: 140 mg        Qty: 1  LOT: [id number]  Exp.Date: 07/26/23  Dosing instructions: every 30 days  The patient has been instructed regarding the correct time, dose, and frequency of taking this medication, including desired effects and most common side effects.   Leida Lauth 2:47 PM 09/05/2022

## 2022-09-10 ENCOUNTER — Other Ambulatory Visit (HOSPITAL_COMMUNITY): Payer: Self-pay

## 2022-09-10 ENCOUNTER — Telehealth: Payer: Self-pay | Admitting: Pharmacy Technician

## 2022-09-10 NOTE — Telephone Encounter (Signed)
Patient Advocate Encounter  Received notification from [facility] that prior authorization for AIMOVIG 140MG  is required.   PA submitted on 4.15.24 Key [id number] Status is pending

## 2022-09-17 NOTE — Telephone Encounter (Signed)
PA Approved

## 2022-11-06 DIAGNOSIS — Z1231 Encounter for screening mammogram for malignant neoplasm of breast: Secondary | ICD-10-CM | POA: Diagnosis not present

## 2022-11-06 DIAGNOSIS — Z01419 Encounter for gynecological examination (general) (routine) without abnormal findings: Secondary | ICD-10-CM | POA: Diagnosis not present

## 2023-01-11 ENCOUNTER — Other Ambulatory Visit: Payer: Self-pay | Admitting: Neurology

## 2023-02-02 ENCOUNTER — Other Ambulatory Visit: Payer: Self-pay | Admitting: Neurology

## 2023-03-13 DIAGNOSIS — E039 Hypothyroidism, unspecified: Secondary | ICD-10-CM | POA: Diagnosis not present

## 2023-03-13 DIAGNOSIS — F900 Attention-deficit hyperactivity disorder, predominantly inattentive type: Secondary | ICD-10-CM | POA: Diagnosis not present

## 2023-03-13 DIAGNOSIS — E7800 Pure hypercholesterolemia, unspecified: Secondary | ICD-10-CM | POA: Diagnosis not present

## 2023-03-29 ENCOUNTER — Ambulatory Visit: Payer: Self-pay | Admitting: Neurology

## 2023-04-02 NOTE — Progress Notes (Unsigned)
NEUROLOGY FOLLOW UP OFFICE NOTE  Dawn Moss [id number]  Assessment/Plan:   Migraine without aura, without status migrainosus, not intractable   Migraine prevention:  Aimovig 140mg  Q28d *** Migraine rescue:  Maxalt 10mg  *** Limit use of pain relievers to no more than 2 days out of week to prevent risk of rebound or medication-overuse headache. Keep headache diary Follow up 1 year ***  Subjective:  Dawn Moss is a 47 year old right-handed female with ADHD and hypothyroidism who follows up for migraines.   UPDATE: She is doing well *** Intensity:  mild-moderate Duration:  15 minutes with rizatriptan Frequency:  once every other month.  Will occur if she is late taking her Aimovig injection (such as if her pharmacy can't get it in on-time. She had a severe migraine 2 weeks ago because she was 2 weeks past due for her injection. Current NSAIDS:  Aleve Current analgesics:  none Current triptans:  rizatriptan 10mg  Current ergotamine:  none Current anti-emetic:  none Current muscle relaxants:  none Current anti-anxiolytic:  none Current sleep aide:  none Current Antihypertensive medications:  none Current Antidepressant medications:  Citalopram 40mg  Current Anticonvulsant medications:  none Current anti-CGRP:  Aimovig 140mg  Current Vitamins/Herbal/Supplements:  none Current Antihistamines/Decongestants:  Claritin, Zyrtec Other therapy:  none Hormone/birth control:  none Other medications:  Dextroamphetamine, levotyroxine   Caffeine:  1 cup of coffee daily.  No soda or tea. Diet:  1 gallon water daily.  Not skips meals.  Grazes during the day and large dinner Exercise:  routine Depression:  Yes but stable with citalopram; Anxiety:  no Other pain:  no Sleep hygiene:  Good.   HISTORY:  Onset:  47 years old, after having her first child.  Started once a month but gradually progressed.  They are severe left sided face and head pressure.  Associated with photophobia,  phonophobia, dizziness, nausea but no visual disturbance, numbness or weakness.  They initially last 1 week (sometimes 2 weeks).  They respond to Maxalt in 30 to 60 minutes but she may have a dull headache for 12 hours and migraine will return.  They initially occur once a month. It tends to occur within the first 2 weeks after her period.  Triggers include heat, stress, loud noise.  Sleep helps relieve them.       Past NSAIDS:  Ibuprofen, naproxen Past analgesics:  Excedrin, Tylenol Past abortive triptans:  none Past abortive ergotamine:  none Past muscle relaxants:  none Past anti-emetic:  none Past antihypertensive medications:  none Past antidepressant medications:  none Past anticonvulsant medications:  topiramate 50mg  (caused depression) Past anti-CGRP:  none Past vitamins/Herbal/Supplements:  CoQ10 Past antihistamines/decongestants:  none Other past therapies:  none     Family history of headache:  Mother (migraines)  PAST MEDICAL HISTORY: Past Medical History:  Diagnosis Date   Depression    Family history of malignant neoplasm of breast    Headache(784.0)    Hypothyroidism    Infertility, female     MEDICATIONS: Current Outpatient Medications on File Prior to Visit  Medication Sig Dispense Refill   AIMOVIG 140 MG/ML SOAJ ADMINISTER 1 ML UNDER THE SKIN EVERY 28 DAYS. 1 mL 4   citalopram (CELEXA) 20 MG tablet Take 1 tablet by mouth every day 30 tablet 3   levothyroxine (SYNTHROID, LEVOTHROID) 25 MCG tablet Take 1 daily by mouth qod 2 tabs by mouth qod 45 tablet 5   loratadine (CLARITIN) 10 MG tablet Take 10 mg by mouth daily.  rizatriptan (MAXALT) 10 MG tablet TAKE 1 TABLET BY MOUTH AT EARLIEST ONSET OF MIGRAINE. MAY REPEAT IN 2 HOURS. MAXIMUM 2 TABLETS IN 24 HOURS 10 tablet 1   VYVANSE 60 MG capsule Take 60 mg by mouth every morning.     No current facility-administered medications on file prior to visit.    ALLERGIES: Allergies  Allergen Reactions   Codeine     Morphine And Codeine    Shellfish Allergy Nausea And Vomiting    FAMILY HISTORY: Family History  Problem Relation Age of Onset   Crohn's disease Mother    Depression Mother    Hyperlipidemia Mother    Hypertension Father    Hyperlipidemia Father    Cancer Maternal Grandmother        breast cancer   Heart disease Maternal Grandfather    Diabetes Paternal Grandfather    Heart disease Paternal Grandfather       Objective:  *** General: No acute distress.  Patient appears ***-groomed.   Head:  Normocephalic/atraumatic Eyes:  Fundi examined but not visualized Neck: supple, no paraspinal tenderness, full range of motion Heart:  Regular rate and rhythm Lungs:  Clear to auscultation bilaterally Back: No paraspinal tenderness Neurological Exam: alert and oriented.  Speech fluent and not dysarthric, language intact.  CN II-XII intact. Bulk and tone normal, muscle strength 5/5 throughout.  Sensation to light touch intact.  Deep tendon reflexes 2+ throughout, toes downgoing.  Finger to nose testing intact.  Gait normal, Romberg negative.   Shon Millet, DO  CC: ***

## 2023-04-03 ENCOUNTER — Ambulatory Visit (INDEPENDENT_AMBULATORY_CARE_PROVIDER_SITE_OTHER): Payer: BC Managed Care – PPO | Admitting: Neurology

## 2023-04-03 ENCOUNTER — Encounter: Payer: Self-pay | Admitting: Neurology

## 2023-04-03 VITALS — BP 118/76 | HR 81 | Ht 64.0 in | Wt 133.8 lb

## 2023-04-03 DIAGNOSIS — G43001 Migraine without aura, not intractable, with status migrainosus: Secondary | ICD-10-CM | POA: Diagnosis not present

## 2023-04-13 ENCOUNTER — Other Ambulatory Visit: Payer: Self-pay | Admitting: Neurology

## 2023-06-13 DIAGNOSIS — E7800 Pure hypercholesterolemia, unspecified: Secondary | ICD-10-CM | POA: Diagnosis not present

## 2023-06-13 DIAGNOSIS — R454 Irritability and anger: Secondary | ICD-10-CM | POA: Diagnosis not present

## 2023-06-13 DIAGNOSIS — F900 Attention-deficit hyperactivity disorder, predominantly inattentive type: Secondary | ICD-10-CM | POA: Diagnosis not present

## 2023-06-13 DIAGNOSIS — E039 Hypothyroidism, unspecified: Secondary | ICD-10-CM | POA: Diagnosis not present

## 2023-06-26 ENCOUNTER — Telehealth: Payer: Self-pay | Admitting: Neurology

## 2023-06-26 MED ORDER — AIMOVIG 140 MG/ML ~~LOC~~ SOAJ: 140.0000 mg | SUBCUTANEOUS | 0 refills | Status: DC

## 2023-06-26 NOTE — Telephone Encounter (Signed)
Tried calling patient to check and see if she received her Aimovig or if she Need samples?   Someone pick up and hung up the phone.

## 2023-06-26 NOTE — Telephone Encounter (Signed)
Spoke to Pharmacy refills received.

## 2023-06-26 NOTE — Telephone Encounter (Signed)
Pt wants to know if she can come by and get a sample of the Aimovig  she is having trouble with the pharmacy getting it

## 2023-07-02 DIAGNOSIS — J209 Acute bronchitis, unspecified: Secondary | ICD-10-CM | POA: Diagnosis not present

## 2023-07-02 DIAGNOSIS — R0981 Nasal congestion: Secondary | ICD-10-CM | POA: Diagnosis not present

## 2023-07-02 DIAGNOSIS — J029 Acute pharyngitis, unspecified: Secondary | ICD-10-CM | POA: Diagnosis not present

## 2023-08-01 ENCOUNTER — Telehealth: Payer: Self-pay | Admitting: Pharmacist

## 2023-08-01 NOTE — Telephone Encounter (Signed)
 Pharmacy Patient Advocate Encounter  Received notification from [facility]  that Prior Authorization for Aimovig 140MG /ML auto-injectors has been APPROVED.   PA #/Case ID/Reference #: [id number]

## 2023-08-02 ENCOUNTER — Other Ambulatory Visit (HOSPITAL_COMMUNITY): Payer: Self-pay

## 2023-08-02 NOTE — Telephone Encounter (Signed)
 Pharmacy Patient Advocate Encounter  Received notification from [facility]  that Prior Authorization for AIMOVIG 140MG  has been APPROVED from 2.4.25 to 3.6.26. Unable to obtain price due to refill too soon rejection, last fill date 2.22.25 next available fill date3.19.25   PA #/Case ID/Reference #: [id number]

## 2023-09-06 ENCOUNTER — Other Ambulatory Visit: Payer: Self-pay | Admitting: Neurology

## 2023-09-19 DIAGNOSIS — H52203 Unspecified astigmatism, bilateral: Secondary | ICD-10-CM | POA: Diagnosis not present

## 2023-09-19 DIAGNOSIS — H5203 Hypermetropia, bilateral: Secondary | ICD-10-CM | POA: Diagnosis not present

## 2023-10-15 ENCOUNTER — Other Ambulatory Visit: Payer: Self-pay | Admitting: Neurology

## 2023-12-11 DIAGNOSIS — E039 Hypothyroidism, unspecified: Secondary | ICD-10-CM | POA: Diagnosis not present

## 2023-12-11 DIAGNOSIS — F900 Attention-deficit hyperactivity disorder, predominantly inattentive type: Secondary | ICD-10-CM | POA: Diagnosis not present

## 2023-12-11 DIAGNOSIS — Z0189 Encounter for other specified special examinations: Secondary | ICD-10-CM | POA: Diagnosis not present

## 2023-12-11 DIAGNOSIS — E7800 Pure hypercholesterolemia, unspecified: Secondary | ICD-10-CM | POA: Diagnosis not present

## 2023-12-11 DIAGNOSIS — Z1159 Encounter for screening for other viral diseases: Secondary | ICD-10-CM | POA: Diagnosis not present

## 2023-12-11 DIAGNOSIS — Z1211 Encounter for screening for malignant neoplasm of colon: Secondary | ICD-10-CM | POA: Diagnosis not present

## 2023-12-12 DIAGNOSIS — Z1231 Encounter for screening mammogram for malignant neoplasm of breast: Secondary | ICD-10-CM | POA: Diagnosis not present

## 2023-12-12 DIAGNOSIS — Z1331 Encounter for screening for depression: Secondary | ICD-10-CM | POA: Diagnosis not present

## 2023-12-12 DIAGNOSIS — Z01419 Encounter for gynecological examination (general) (routine) without abnormal findings: Secondary | ICD-10-CM | POA: Diagnosis not present

## 2024-02-21 NOTE — Progress Notes (Deleted)
 Virtual Visit via Video Note  Consent was obtained for video visit:  Yes.   Answered questions that patient had about telehealth interaction:  Yes.   I discussed the limitations, risks, security and privacy concerns of performing an evaluation and management service by telemedicine. I also discussed with the patient that there may be a patient responsible charge related to this service. The patient expressed understanding and agreed to proceed.  Pt location: Home Physician Location: office Name of referring provider:  No ref. provider found I connected with Ronal JAYSON Mccreedy at patients initiation/request on 02/24/2024 at  9:30 AM EDT by video enabled telemedicine application and verified that I am speaking with the correct person using two identifiers. Pt MRN:  [id number] Pt DOB:  03-07-1976 Video Participants:  Ronal JAYSON Mccreedy  Assessment/Plan:   Migraine without aura, without status migrainosus, not intractable    Migraine prevention:  Aimovig  140mg  every 28 days Migraine rescue:  Maxalt  10mg  Lifestyle modification: Limit use of pain relievers to no more than 9 days out of the month to prevent risk of rebound or medication-overuse headache. Diet modification/hydration/caffeine cessation Routine exercise Sleep hygiene Consider vitamins/supplements:  magnesium citrate 400mg  daily, riboflavin 400mg  daily, CoQ10 100mg  three times daily Keep headache diary Follow up ***   Subjective:  Dawn Moss is a 48 year old right-handed female with ADHD and hypothyroidism who follows up for migraines.   UPDATE: She is feeling well. Intensity:  mild-moderate Duration:  15 minutes with rizatriptan  Frequency:  once every other month, may have a daily migraine for a week with change in seasons (occurs every 6 months) Current NSAIDS:  Aleve Current analgesics:  none Current triptans:  rizatriptan  10mg  Current ergotamine:  none Current anti-emetic:  none Current muscle relaxants:  none Current  anti-anxiolytic:  none Current sleep aide:  none Current Antihypertensive medications:  none Current Antidepressant medications:  Citalopram  40mg  Current Anticonvulsant medications:  none Current anti-CGRP:  Aimovig  140mg  Current Vitamins/Herbal/Supplements:  none Current Antihistamines/Decongestants:  Claritin, Zyrtec Other therapy:  none Hormone/birth control:  none Other medications:  Dextroamphetamine, levotyroxine   Caffeine:  1 cup of coffee daily.  No soda or tea. Diet:  1 gallon water daily.  Not skips meals.  Grazes during the day and large dinner Exercise:  routine Depression:  Yes but stable with citalopram ; Anxiety:  no Other pain:  no Sleep hygiene:  Good.   HISTORY:  Onset:  48 years old, after having her first child.  Started once a month but gradually progressed.  They are severe left sided face and head pressure.  Associated with photophobia, phonophobia, dizziness, nausea but no visual disturbance, numbness or weakness.  They initially last 1 week (sometimes 2 weeks).  They respond to Maxalt  in 30 to 60 minutes but she may have a dull headache for 12 hours and migraine will return.  They initially occur once a month. It tends to occur within the first 2 weeks after her period.  Triggers include heat, stress, loud noise.  Sleep helps relieve them.       Past NSAIDS:  Ibuprofen , naproxen Past analgesics:  Excedrin, Tylenol  Past abortive triptans:  none Past abortive ergotamine:  none Past muscle relaxants:  none Past anti-emetic:  none Past antihypertensive medications:  none Past antidepressant medications:  none Past anticonvulsant medications:  topiramate  50mg  (caused depression) Past anti-CGRP:  none Past vitamins/Herbal/Supplements:  CoQ10 Past antihistamines/decongestants:  none Other past therapies:  none     Family history of headache:  Mother (migraines)  Past Medical History: Past Medical History:  Diagnosis Date   Depression    Family history of  malignant neoplasm of breast    Headache(784.0)    Hypothyroidism    Infertility, female     Medications: Outpatient Encounter Medications as of 02/24/2024  Medication Sig   citalopram  (CELEXA ) 40 MG tablet Take 40 mg by mouth daily.   Erenumab -aooe (AIMOVIG ) 140 MG/ML SOAJ ADMINISTER 140MG  UNDER THE SKIN EVERY 28 DAYS AS DIRECTED   levothyroxine  (SYNTHROID , LEVOTHROID) 25 MCG tablet Take 1 daily by mouth qod 2 tabs by mouth qod   loratadine (CLARITIN) 10 MG tablet Take 10 mg by mouth daily.   rizatriptan  (MAXALT ) 10 MG tablet TAKE 1 TABLET BY MOUTH AT EARLIEST ONSET OF MIGRAINE. MAY REPEAT IN 2 HOURS. MAXIMUM 2 TABLETS IN 24 HOURS   VYVANSE 40 MG capsule Take 40 mg by mouth every morning.   No facility-administered encounter medications on file as of 02/24/2024.    Allergies: Allergies  Allergen Reactions   Codeine    Morphine  And Codeine    Shellfish Allergy Nausea And Vomiting    Family History: Family History  Problem Relation Age of Onset   Crohn's disease Mother    Depression Mother    Hyperlipidemia Mother    Hypertension Father    Hyperlipidemia Father    Cancer Maternal Grandmother        breast cancer   Heart disease Maternal Grandfather    Diabetes Paternal Grandfather    Heart disease Paternal Grandfather     Observations/Objective:   No acute distress.  Alert and oriented.  Speech fluent and not dysarthric.  Language intact.  Eyes orthophoric on primary gaze.  Face symmetric.   Follow Up Instructions:    -I discussed the assessment and treatment plan with the patient. The patient was provided an opportunity to ask questions and all were answered. The patient agreed with the plan and demonstrated an understanding of the instructions.   The patient was advised to call back or seek an in-person evaluation if the symptoms worsen or if the condition fails to improve as anticipated.   [name], DO

## 2024-02-24 ENCOUNTER — Telehealth: Admitting: Neurology

## 2024-03-12 NOTE — Progress Notes (Signed)
 Virtual Visit via Video Note  Consent was obtained for video visit:  Yes.   Answered questions that patient had about telehealth interaction:  Yes.   I discussed the limitations, risks, security and privacy concerns of performing an evaluation and management service by telemedicine. I also discussed with the patient that there may be a patient responsible charge related to this service. The patient expressed understanding and agreed to proceed.  Pt location: Home Physician Location: office Name of referring provider:  No ref. provider found I connected with Dawn Moss at patients initiation/request on 03/13/2024 at  8:10 AM EDT by video enabled telemedicine application and verified that I am speaking with the correct person using two identifiers. Pt MRN:  [id number] Pt DOB:  06-May-1976 Video Participants:  Dawn Moss  Assessment/Plan:   Migraine without aura, without status migrainosus, not intractable    Migraine prevention:  Aimovig  140mg  every 28 days Migraine rescue:  Maxalt  10mg  Lifestyle modification: Limit use of pain relievers to no more than 9 days out of the month to prevent risk of rebound or medication-overuse headache. Diet modification/hydration/caffeine cessation Routine exercise Sleep hygiene Consider vitamins/supplements:  magnesium citrate 400mg  daily, riboflavin 400mg  daily, CoQ10 100mg  three times daily Keep headache diary Follow up one year   Subjective:  Dawn Moss is a 48 year old right-handed female with ADHD and hypothyroidism who follows up for migraines.   UPDATE: She is feeling well. Intensity:  mild-moderate Duration:  30 minutes with rizatriptan  Frequency:  1 a month Current NSAIDS:  Aleve Current analgesics:  none Current triptans:  rizatriptan  10mg  Current ergotamine:  none Current anti-emetic:  none Current muscle relaxants:  none Current anti-anxiolytic:  none Current sleep aide:  none Current Antihypertensive medications:   none Current Antidepressant medications:  Citalopram  40mg  Current Anticonvulsant medications:  none Current anti-CGRP:  Aimovig  140mg  Current Vitamins/Herbal/Supplements:  none Current Antihistamines/Decongestants:  Claritin Other therapy:  none Hormone/birth control:  none Other medications:  Vyvanse, levotyroxine   Caffeine:  1 cup of coffee daily.  No soda or tea. Diet:  1 gallon water daily.  Not skips meals.  Grazes during the day and large dinner Exercise:  routine Depression:  Yes but stable with citalopram ; Anxiety:  no Other pain:  no Sleep hygiene:  Good.   HISTORY:  Onset:  48 years old, after having her first child.  Started once a month but gradually progressed.  They are severe left sided face and head pressure.  Associated with photophobia, phonophobia, dizziness, nausea but no visual disturbance, numbness or weakness.  They initially last 1 week (sometimes 2 weeks).  They respond to Maxalt  in 30 to 60 minutes but she may have a dull headache for 12 hours and migraine will return.  They initially occur once a month. It tends to occur within the first 2 weeks after her period.  Triggers include heat, stress, loud noise.  Sleep helps relieve them.       Past NSAIDS:  Ibuprofen , naproxen Past analgesics:  Excedrin, Tylenol  Past abortive triptans:  none Past abortive ergotamine:  none Past muscle relaxants:  none Past anti-emetic:  none Past antihypertensive medications:  none Past antidepressant medications:  none Past anticonvulsant medications:  topiramate  50mg  (caused depression) Past anti-CGRP:  none Past vitamins/Herbal/Supplements:  CoQ10 Past antihistamines/decongestants:  Zyrtec Other past therapies:  none     Family history of headache:  Mother (migraines)  Past Medical History: Past Medical History:  Diagnosis Date   Depression  Family history of malignant neoplasm of breast    Headache(784.0)    Hypothyroidism    Infertility, female      Medications: Outpatient Encounter Medications as of 03/13/2024  Medication Sig   citalopram  (CELEXA ) 40 MG tablet Take 40 mg by mouth daily.   Erenumab -aooe (AIMOVIG ) 140 MG/ML SOAJ ADMINISTER 140MG  UNDER THE SKIN EVERY 28 DAYS AS DIRECTED   levothyroxine  (SYNTHROID , LEVOTHROID) 25 MCG tablet Take 1 daily by mouth qod 2 tabs by mouth qod   loratadine (CLARITIN) 10 MG tablet Take 10 mg by mouth daily.   rizatriptan  (MAXALT ) 10 MG tablet TAKE 1 TABLET BY MOUTH AT EARLIEST ONSET OF MIGRAINE. MAY REPEAT IN 2 HOURS. MAXIMUM 2 TABLETS IN 24 HOURS   VYVANSE 40 MG capsule Take 40 mg by mouth every morning.   No facility-administered encounter medications on file as of 03/13/2024.    Allergies: Allergies  Allergen Reactions   Codeine    Morphine  And Codeine    Shellfish Allergy Nausea And Vomiting    Family History: Family History  Problem Relation Age of Onset   Crohn's disease Mother    Depression Mother    Hyperlipidemia Mother    Hypertension Father    Hyperlipidemia Father    Cancer Maternal Grandmother        breast cancer   Heart disease Maternal Grandfather    Diabetes Paternal Grandfather    Heart disease Paternal Grandfather     Observations/Objective:   No acute distress.  Alert and oriented.  Speech fluent and not dysarthric.  Language intact.  Eyes orthophoric on primary gaze.  Face symmetric.   Follow Up Instructions:    -I discussed the assessment and treatment plan with the patient. The patient was provided an opportunity to ask questions and all were answered. The patient agreed with the plan and demonstrated an understanding of the instructions.   The patient was advised to call back or seek an in-person evaluation if the symptoms worsen or if the condition fails to improve as anticipated.   Dawn Lamar Dunnings, DO

## 2024-03-13 ENCOUNTER — Encounter: Payer: Self-pay | Admitting: Neurology

## 2024-03-13 ENCOUNTER — Telehealth (INDEPENDENT_AMBULATORY_CARE_PROVIDER_SITE_OTHER): Admitting: Neurology

## 2024-03-13 DIAGNOSIS — G43001 Migraine without aura, not intractable, with status migrainosus: Secondary | ICD-10-CM

## 2024-03-13 MED ORDER — RIZATRIPTAN BENZOATE 10 MG PO TABS: ORAL_TABLET | ORAL | 11 refills | Status: AC

## 2024-03-13 MED ORDER — AIMOVIG 140 MG/ML ~~LOC~~ SOAJ: SUBCUTANEOUS | 11 refills | Status: AC

## 2024-03-13 NOTE — Patient Instructions (Signed)
 Aimovig  every 4 weeks Rizatriptan  as needed

## 2024-03-30 ENCOUNTER — Telehealth: Payer: BC Managed Care – PPO | Admitting: Neurology

## 2025-03-15 ENCOUNTER — Ambulatory Visit: Admitting: Neurology
